# Patient Record
Sex: Male | Born: 1935 | Race: White | Hispanic: No | Marital: Married
Health system: Southern US, Community
[De-identification: ages and names within clinical notes are randomized; demographics above are authoritative.]

---

## 2008-05-03 ENCOUNTER — Ambulatory Visit: Payer: Self-pay | Admitting: Gastroenterology

## 2008-06-22 ENCOUNTER — Ambulatory Visit: Payer: Self-pay | Admitting: Family Medicine

## 2008-07-04 ENCOUNTER — Ambulatory Visit: Payer: Self-pay | Admitting: Family Medicine

## 2008-08-02 ENCOUNTER — Ambulatory Visit: Payer: Self-pay | Admitting: Family Medicine

## 2008-09-01 ENCOUNTER — Ambulatory Visit: Payer: Self-pay | Admitting: Family Medicine

## 2008-10-01 ENCOUNTER — Ambulatory Visit: Payer: Self-pay | Admitting: Family Medicine

## 2009-10-25 ENCOUNTER — Ambulatory Visit: Payer: Self-pay | Admitting: Ophthalmology

## 2010-01-24 ENCOUNTER — Ambulatory Visit: Payer: Self-pay | Admitting: Ophthalmology

## 2012-07-13 ENCOUNTER — Ambulatory Visit: Payer: Self-pay | Admitting: Radiation Oncology

## 2012-08-01 ENCOUNTER — Ambulatory Visit: Payer: Self-pay | Admitting: Radiation Oncology

## 2012-08-13 ENCOUNTER — Inpatient Hospital Stay: Payer: Self-pay | Admitting: Specialist

## 2012-08-13 LAB — COMPREHENSIVE METABOLIC PANEL
Alkaline Phosphatase: 100 U/L (ref 50–136)
BUN: 20 mg/dL — ABNORMAL HIGH (ref 7–18)
Calcium, Total: 8.6 mg/dL (ref 8.5–10.1)
Chloride: 110 mmol/L — ABNORMAL HIGH (ref 98–107)
Co2: 24 mmol/L (ref 21–32)
Creatinine: 1.55 mg/dL — ABNORMAL HIGH (ref 0.60–1.30)
EGFR (Non-African Amer.): 43 — ABNORMAL LOW
Glucose: 206 mg/dL — ABNORMAL HIGH (ref 65–99)
Potassium: 5.3 mmol/L — ABNORMAL HIGH (ref 3.5–5.1)
SGOT(AST): 27 U/L (ref 15–37)
SGPT (ALT): 24 U/L (ref 12–78)
Sodium: 138 mmol/L (ref 136–145)
Total Protein: 6.9 g/dL (ref 6.4–8.2)

## 2012-08-13 LAB — IRON AND TIBC
Iron Bind.Cap.(Total): 436 ug/dL (ref 250–450)
Iron: 24 ug/dL — ABNORMAL LOW (ref 65–175)
Unbound Iron-Bind.Cap.: 412 ug/dL

## 2012-08-13 LAB — FOLATE: Folic Acid: 39.2 ng/mL (ref 3.1–100.0)

## 2012-08-13 LAB — CBC WITH DIFFERENTIAL/PLATELET
Basophil #: 0.1 10*3/uL (ref 0.0–0.1)
Basophil %: 1 %
Eosinophil %: 2.3 %
HCT: 22.3 % — ABNORMAL LOW (ref 40.0–52.0)
HGB: 7 g/dL — ABNORMAL LOW (ref 13.0–18.0)
Lymphocyte #: 0.5 10*3/uL — ABNORMAL LOW (ref 1.0–3.6)
Lymphocyte %: 10.3 %
MCH: 26.9 pg (ref 26.0–34.0)
MCV: 85 fL (ref 80–100)
Monocyte %: 8.3 %
Platelet: 194 10*3/uL (ref 150–440)
RBC: 2.61 10*6/uL — ABNORMAL LOW (ref 4.40–5.90)
RDW: 15.5 % — ABNORMAL HIGH (ref 11.5–14.5)

## 2012-08-13 LAB — PROTIME-INR: Prothrombin Time: 14.1 secs (ref 11.5–14.7)

## 2012-08-13 LAB — HEMOGLOBIN A1C: Hemoglobin A1C: <3.5 % — ABNORMAL LOW (ref 4.2–6.3)

## 2012-08-14 LAB — BASIC METABOLIC PANEL
BUN: 20 mg/dL — ABNORMAL HIGH (ref 7–18)
Chloride: 110 mmol/L — ABNORMAL HIGH (ref 98–107)
Osmolality: 282 (ref 275–301)
Potassium: 4.6 mmol/L (ref 3.5–5.1)

## 2012-08-14 LAB — CBC WITH DIFFERENTIAL/PLATELET
Basophil #: 0.1 10*3/uL (ref 0.0–0.1)
Basophil %: 0.8 %
Eosinophil %: 2.9 %
HCT: 25.8 % — ABNORMAL LOW (ref 40.0–52.0)
Lymphocyte %: 13.4 %
MCH: 28.1 pg (ref 26.0–34.0)
Monocyte %: 10.1 %
Neutrophil #: 5.6 10*3/uL (ref 1.4–6.5)
Platelet: 180 10*3/uL (ref 150–440)
RDW: 15.5 % — ABNORMAL HIGH (ref 11.5–14.5)

## 2012-08-15 LAB — CBC WITH DIFFERENTIAL/PLATELET
Basophil %: 0.9 %
Eosinophil %: 2.3 %
HCT: 25 % — ABNORMAL LOW (ref 40.0–52.0)
HGB: 8.1 g/dL — ABNORMAL LOW (ref 13.0–18.0)
MCH: 27.5 pg (ref 26.0–34.0)
MCHC: 32.4 g/dL (ref 32.0–36.0)
MCV: 85 fL (ref 80–100)
Neutrophil %: 75.7 %
RBC: 2.95 10*6/uL — ABNORMAL LOW (ref 4.40–5.90)
WBC: 7.6 10*3/uL (ref 3.8–10.6)

## 2012-08-20 ENCOUNTER — Ambulatory Visit: Payer: Self-pay | Admitting: Urology

## 2012-08-20 LAB — HEMOGLOBIN: HGB: 8.6 g/dL — ABNORMAL LOW (ref 13.0–18.0)

## 2012-09-01 ENCOUNTER — Ambulatory Visit: Payer: Self-pay | Admitting: Urology

## 2012-09-01 ENCOUNTER — Ambulatory Visit: Payer: Self-pay | Admitting: Radiation Oncology

## 2012-10-01 ENCOUNTER — Ambulatory Visit: Payer: Self-pay | Admitting: Radiation Oncology

## 2012-11-10 ENCOUNTER — Ambulatory Visit: Payer: Self-pay | Admitting: Family Medicine

## 2012-11-18 ENCOUNTER — Emergency Department: Payer: Self-pay

## 2012-11-18 LAB — COMPREHENSIVE METABOLIC PANEL
Alkaline Phosphatase: 148 U/L — ABNORMAL HIGH (ref 50–136)
Anion Gap: 6 — ABNORMAL LOW (ref 7–16)
BUN: 19 mg/dL — ABNORMAL HIGH (ref 7–18)
Calcium, Total: 9.3 mg/dL (ref 8.5–10.1)
Chloride: 98 mmol/L (ref 98–107)
Co2: 27 mmol/L (ref 21–32)
Creatinine: 1.35 mg/dL — ABNORMAL HIGH (ref 0.60–1.30)
EGFR (African American): 60 — ABNORMAL LOW
Osmolality: 272 (ref 275–301)
SGOT(AST): 39 U/L — ABNORMAL HIGH (ref 15–37)
Sodium: 131 mmol/L — ABNORMAL LOW (ref 136–145)
Total Protein: 8 g/dL (ref 6.4–8.2)

## 2012-11-18 LAB — CBC
HCT: 37.3 % — ABNORMAL LOW (ref 40.0–52.0)
HGB: 12.2 g/dL — ABNORMAL LOW (ref 13.0–18.0)
MCH: 24.8 pg — ABNORMAL LOW (ref 26.0–34.0)
MCHC: 32.6 g/dL (ref 32.0–36.0)
Platelet: 235 10*3/uL (ref 150–440)
RBC: 4.9 10*6/uL (ref 4.40–5.90)
RDW: 17.2 % — ABNORMAL HIGH (ref 11.5–14.5)
WBC: 10.7 10*3/uL — ABNORMAL HIGH (ref 3.8–10.6)

## 2012-11-18 LAB — URINALYSIS, COMPLETE
Bilirubin,UR: NEGATIVE
Blood: NEGATIVE
Ketone: NEGATIVE
Leukocyte Esterase: NEGATIVE
Nitrite: NEGATIVE
Ph: 6 (ref 4.5–8.0)
Specific Gravity: 1.024 (ref 1.003–1.030)
Squamous Epithelial: NONE SEEN

## 2012-11-19 ENCOUNTER — Ambulatory Visit: Payer: Self-pay | Admitting: Internal Medicine

## 2012-11-19 LAB — PROTIME-INR
INR: 1.1
Prothrombin Time: 13.9 secs (ref 11.5–14.7)

## 2012-11-20 LAB — CANCER ANTIGEN 19-9: CA 19-9: 189700 U/mL — ABNORMAL HIGH (ref 0–35)

## 2012-11-23 ENCOUNTER — Ambulatory Visit: Payer: Self-pay | Admitting: Internal Medicine

## 2012-11-23 LAB — PSA: PSA: 2.1 ng/mL (ref 0.0–4.0)

## 2012-11-25 LAB — HEPATIC FUNCTION PANEL A (ARMC)
Albumin: 3.5 g/dL (ref 3.4–5.0)
Alkaline Phosphatase: 137 U/L — ABNORMAL HIGH (ref 50–136)
Bilirubin,Total: 0.4 mg/dL (ref 0.2–1.0)
Total Protein: 7.3 g/dL (ref 6.4–8.2)

## 2012-11-30 ENCOUNTER — Ambulatory Visit: Payer: Self-pay | Admitting: Cardiothoracic Surgery

## 2012-12-01 ENCOUNTER — Ambulatory Visit: Payer: Self-pay | Admitting: Internal Medicine

## 2012-12-08 LAB — COMPREHENSIVE METABOLIC PANEL
Albumin: 3.3 g/dL — ABNORMAL LOW (ref 3.4–5.0)
Anion Gap: 9 (ref 7–16)
BUN: 17 mg/dL (ref 7–18)
Bilirubin,Total: 0.3 mg/dL (ref 0.2–1.0)
Co2: 28 mmol/L (ref 21–32)
Creatinine: 1.28 mg/dL (ref 0.60–1.30)
EGFR (African American): >60
EGFR (Non-African Amer.): 55 — ABNORMAL LOW
Glucose: 187 mg/dL — ABNORMAL HIGH (ref 65–99)
Potassium: 4.6 mmol/L (ref 3.5–5.1)
SGOT(AST): 27 U/L (ref 15–37)
SGPT (ALT): 24 U/L (ref 12–78)
Sodium: 134 mmol/L — ABNORMAL LOW (ref 136–145)
Total Protein: 6.7 g/dL (ref 6.4–8.2)

## 2012-12-08 LAB — CBC CANCER CENTER
Basophil %: 1.1 %
HCT: 30.9 % — ABNORMAL LOW (ref 40.0–52.0)
Lymphocyte #: 0.6 x10 3/mm — ABNORMAL LOW (ref 1.0–3.6)
Lymphocyte %: 7.6 %
MCH: 24.7 pg — ABNORMAL LOW (ref 26.0–34.0)
Monocyte #: 0.9 x10 3/mm (ref 0.2–1.0)
Neutrophil #: 6.5 x10 3/mm (ref 1.4–6.5)
WBC: 8.5 x10 3/mm (ref 3.8–10.6)

## 2012-12-09 ENCOUNTER — Inpatient Hospital Stay: Payer: Self-pay | Admitting: Internal Medicine

## 2012-12-09 LAB — BASIC METABOLIC PANEL
Anion Gap: 7 (ref 7–16)
BUN: 22 mg/dL — ABNORMAL HIGH (ref 7–18)
Calcium, Total: 9.1 mg/dL (ref 8.5–10.1)
Chloride: 97 mmol/L — ABNORMAL LOW (ref 98–107)
Creatinine: 1.17 mg/dL (ref 0.60–1.30)
EGFR (African American): >60
Glucose: 202 mg/dL — ABNORMAL HIGH (ref 65–99)
Potassium: 4.5 mmol/L (ref 3.5–5.1)
Sodium: 129 mmol/L — ABNORMAL LOW (ref 136–145)

## 2012-12-10 LAB — BASIC METABOLIC PANEL
Anion Gap: 9 (ref 7–16)
Calcium, Total: 8.6 mg/dL (ref 8.5–10.1)
Chloride: 100 mmol/L (ref 98–107)
Creatinine: 1.38 mg/dL — ABNORMAL HIGH (ref 0.60–1.30)
EGFR (African American): 58 — ABNORMAL LOW
EGFR (Non-African Amer.): 50 — ABNORMAL LOW
Glucose: 177 mg/dL — ABNORMAL HIGH (ref 65–99)

## 2012-12-10 LAB — CBC WITH DIFFERENTIAL/PLATELET
Eosinophil #: 0 10*3/uL (ref 0.0–0.7)
Eosinophil %: 0 %
HCT: 31.8 % — ABNORMAL LOW (ref 40.0–52.0)
HGB: 10.6 g/dL — ABNORMAL LOW (ref 13.0–18.0)
Lymphocyte %: 3.3 %
MCH: 25.1 pg — ABNORMAL LOW (ref 26.0–34.0)
MCHC: 33.3 g/dL (ref 32.0–36.0)
MCV: 75 fL — ABNORMAL LOW (ref 80–100)
Monocyte #: 0.8 x10 3/mm (ref 0.2–1.0)
Monocyte %: 6.8 %
Neutrophil #: 10.8 10*3/uL — ABNORMAL HIGH (ref 1.4–6.5)
Neutrophil %: 89.7 %
Platelet: 185 10*3/uL (ref 150–440)
RBC: 4.22 10*6/uL — ABNORMAL LOW (ref 4.40–5.90)
RDW: 16.4 % — ABNORMAL HIGH (ref 11.5–14.5)
WBC: 12.1 10*3/uL — ABNORMAL HIGH (ref 3.8–10.6)

## 2012-12-11 LAB — BASIC METABOLIC PANEL
Anion Gap: 8 (ref 7–16)
BUN: 20 mg/dL — ABNORMAL HIGH (ref 7–18)
Co2: 24 mmol/L (ref 21–32)
EGFR (African American): >60
Glucose: 138 mg/dL — ABNORMAL HIGH (ref 65–99)
Potassium: 4 mmol/L (ref 3.5–5.1)
Sodium: 133 mmol/L — ABNORMAL LOW (ref 136–145)

## 2012-12-12 LAB — COMPREHENSIVE METABOLIC PANEL
Albumin: 2.8 g/dL — ABNORMAL LOW (ref 3.4–5.0)
Bilirubin,Total: 0.4 mg/dL (ref 0.2–1.0)
Chloride: 102 mmol/L (ref 98–107)
EGFR (African American): >60
Glucose: 133 mg/dL — ABNORMAL HIGH (ref 65–99)
Potassium: 3.9 mmol/L (ref 3.5–5.1)

## 2012-12-12 LAB — MAGNESIUM: Magnesium: 1.5 mg/dL — ABNORMAL LOW

## 2012-12-15 ENCOUNTER — Ambulatory Visit: Payer: Self-pay | Admitting: Internal Medicine

## 2012-12-15 LAB — CBC CANCER CENTER
Basophil #: 0.1 x10 3/mm (ref 0.0–0.1)
Eosinophil %: 2.7 %
HCT: 32.7 % — ABNORMAL LOW (ref 40.0–52.0)
Lymphocyte #: 0.8 x10 3/mm — ABNORMAL LOW (ref 1.0–3.6)
MCH: 24.6 pg — ABNORMAL LOW (ref 26.0–34.0)
MCHC: 32.1 g/dL (ref 32.0–36.0)
MCV: 77 fL — ABNORMAL LOW (ref 80–100)
Monocyte #: 0.6 x10 3/mm (ref 0.2–1.0)
Neutrophil #: 15.6 x10 3/mm — ABNORMAL HIGH (ref 1.4–6.5)
Platelet: 163 x10 3/mm (ref 150–440)
RDW: 16.3 % — ABNORMAL HIGH (ref 11.5–14.5)
WBC: 17.6 x10 3/mm — ABNORMAL HIGH (ref 3.8–10.6)

## 2012-12-15 LAB — CREATININE, SERUM: EGFR (Non-African Amer.): 54 — ABNORMAL LOW

## 2012-12-15 LAB — MAGNESIUM: Magnesium: 2 mg/dL

## 2012-12-18 LAB — CBC CANCER CENTER
Eosinophil %: 2.7 %
HCT: 31.8 % — ABNORMAL LOW (ref 40.0–52.0)
Lymphocyte #: 0.7 x10 3/mm — ABNORMAL LOW (ref 1.0–3.6)
Lymphocyte %: 9.5 %
MCH: 25 pg — ABNORMAL LOW (ref 26.0–34.0)
MCHC: 32.8 g/dL (ref 32.0–36.0)
MCV: 76 fL — ABNORMAL LOW (ref 80–100)
Neutrophil #: 6.2 x10 3/mm (ref 1.4–6.5)
Neutrophil %: 80.1 %
Platelet: 159 x10 3/mm (ref 150–440)
RDW: 16.7 % — ABNORMAL HIGH (ref 11.5–14.5)
WBC: 7.8 x10 3/mm (ref 3.8–10.6)

## 2012-12-18 LAB — CREATININE, SERUM: Creatinine: 1.17 mg/dL (ref 0.60–1.30)

## 2012-12-18 LAB — MAGNESIUM: Magnesium: 1.6 mg/dL — ABNORMAL LOW

## 2012-12-18 LAB — POTASSIUM: Potassium: 4.2 mmol/L (ref 3.5–5.1)

## 2012-12-22 LAB — COMPREHENSIVE METABOLIC PANEL
Albumin: 3 g/dL — ABNORMAL LOW (ref 3.4–5.0)
Alkaline Phosphatase: 171 U/L — ABNORMAL HIGH (ref 50–136)
Anion Gap: 12 (ref 7–16)
BUN: 13 mg/dL (ref 7–18)
Bilirubin,Total: 0.4 mg/dL (ref 0.2–1.0)
Chloride: 94 mmol/L — ABNORMAL LOW (ref 98–107)
Co2: 27 mmol/L (ref 21–32)
Creatinine: 1.36 mg/dL — ABNORMAL HIGH (ref 0.60–1.30)
EGFR (Non-African Amer.): 51 — ABNORMAL LOW
Glucose: 162 mg/dL — ABNORMAL HIGH (ref 65–99)
Potassium: 4 mmol/L (ref 3.5–5.1)
SGPT (ALT): 24 U/L (ref 12–78)

## 2012-12-22 LAB — CBC CANCER CENTER
Basophil %: 0.6 %
Eosinophil #: 0.4 x10 3/mm (ref 0.0–0.7)
Eosinophil %: 2.9 %
HGB: 10.4 g/dL — ABNORMAL LOW (ref 13.0–18.0)
Lymphocyte %: 5.4 %
MCHC: 32.1 g/dL (ref 32.0–36.0)
MCV: 76 fL — ABNORMAL LOW (ref 80–100)
Monocyte #: 1.2 x10 3/mm — ABNORMAL HIGH (ref 0.2–1.0)
Neutrophil #: 11.9 x10 3/mm — ABNORMAL HIGH (ref 1.4–6.5)
Neutrophil %: 82.9 %
RBC: 4.25 10*6/uL — ABNORMAL LOW (ref 4.40–5.90)
RDW: 17.1 % — ABNORMAL HIGH (ref 11.5–14.5)

## 2012-12-23 ENCOUNTER — Ambulatory Visit: Payer: Self-pay | Admitting: Internal Medicine

## 2012-12-24 LAB — CREATININE, SERUM
Creatinine: 1.4 mg/dL — ABNORMAL HIGH (ref 0.60–1.30)
EGFR (African American): 57 — ABNORMAL LOW
EGFR (Non-African Amer.): 49 — ABNORMAL LOW

## 2012-12-24 LAB — CBC CANCER CENTER
Basophil #: 0.1 x10 3/mm (ref 0.0–0.1)
Basophil %: 0.6 %
Eosinophil #: 0.7 x10 3/mm (ref 0.0–0.7)
Eosinophil %: 4.1 %
HCT: 32.9 % — ABNORMAL LOW (ref 40.0–52.0)
HGB: 10.4 g/dL — ABNORMAL LOW (ref 13.0–18.0)
Lymphocyte #: 0.8 x10 3/mm — ABNORMAL LOW (ref 1.0–3.6)
Lymphocyte %: 4.6 %
MCH: 24.4 pg — ABNORMAL LOW (ref 26.0–34.0)
Monocyte %: 6.4 %
Neutrophil #: 14.7 x10 3/mm — ABNORMAL HIGH (ref 1.4–6.5)
Neutrophil %: 84.3 %
Platelet: 187 x10 3/mm (ref 150–440)

## 2012-12-29 LAB — CBC CANCER CENTER
Eosinophil #: 0.3 x10 3/mm (ref 0.0–0.7)
HCT: 34.1 % — ABNORMAL LOW (ref 40.0–52.0)
HGB: 10.9 g/dL — ABNORMAL LOW (ref 13.0–18.0)
MCV: 78 fL — ABNORMAL LOW (ref 80–100)
Monocyte %: 7.6 %
Neutrophil #: 11.1 x10 3/mm — ABNORMAL HIGH (ref 1.4–6.5)
Neutrophil %: 81.6 %
RBC: 4.38 10*6/uL — ABNORMAL LOW (ref 4.40–5.90)
WBC: 13.6 x10 3/mm — ABNORMAL HIGH (ref 3.8–10.6)

## 2012-12-29 LAB — COMPREHENSIVE METABOLIC PANEL
Albumin: 3.2 g/dL — ABNORMAL LOW (ref 3.4–5.0)
Anion Gap: 11 (ref 7–16)
Bilirubin,Total: 0.4 mg/dL (ref 0.2–1.0)
Calcium, Total: 9.2 mg/dL (ref 8.5–10.1)
Chloride: 98 mmol/L (ref 98–107)
Co2: 28 mmol/L (ref 21–32)
EGFR (African American): >60
Osmolality: 276 (ref 275–301)
Potassium: 5.1 mmol/L (ref 3.5–5.1)
SGOT(AST): 22 U/L (ref 15–37)
SGPT (ALT): 20 U/L (ref 12–78)

## 2013-01-01 ENCOUNTER — Ambulatory Visit: Payer: Self-pay | Admitting: Internal Medicine

## 2013-01-05 LAB — MAGNESIUM: Magnesium: 2.2 mg/dL

## 2013-01-05 LAB — COMPREHENSIVE METABOLIC PANEL
Anion Gap: 8 (ref 7–16)
BUN: 12 mg/dL (ref 7–18)
Chloride: 97 mmol/L — ABNORMAL LOW (ref 98–107)
Co2: 30 mmol/L (ref 21–32)
Creatinine: 1.39 mg/dL — ABNORMAL HIGH (ref 0.60–1.30)
EGFR (Non-African Amer.): 50 — ABNORMAL LOW
Osmolality: 275 (ref 275–301)
SGOT(AST): 24 U/L (ref 15–37)
SGPT (ALT): 19 U/L (ref 12–78)
Sodium: 135 mmol/L — ABNORMAL LOW (ref 136–145)
Total Protein: 6.7 g/dL (ref 6.4–8.2)

## 2013-01-05 LAB — CBC CANCER CENTER
Basophil #: 0.1 x10 3/mm (ref 0.0–0.1)
Basophil %: 1.5 %
Eosinophil %: 4.1 %
MCH: 25.4 pg — ABNORMAL LOW (ref 26.0–34.0)
MCHC: 32.3 g/dL (ref 32.0–36.0)
Monocyte #: 1.2 x10 3/mm — ABNORMAL HIGH (ref 0.2–1.0)
Neutrophil %: 72.5 %
Platelet: 233 x10 3/mm (ref 150–440)
WBC: 9.6 x10 3/mm (ref 3.8–10.6)

## 2013-01-12 LAB — CBC CANCER CENTER
Basophil #: 0.2 x10 3/mm — ABNORMAL HIGH (ref 0.0–0.1)
Basophil %: 0.9 %
Eosinophil %: 3.7 %
HCT: 32.8 % — ABNORMAL LOW (ref 40.0–52.0)
Lymphocyte #: 0.9 x10 3/mm — ABNORMAL LOW (ref 1.0–3.6)
MCH: 25.3 pg — ABNORMAL LOW (ref 26.0–34.0)
MCV: 78 fL — ABNORMAL LOW (ref 80–100)
Monocyte %: 4.1 %
Neutrophil #: 17.5 x10 3/mm — ABNORMAL HIGH (ref 1.4–6.5)
Neutrophil %: 86.9 %
Platelet: 199 x10 3/mm (ref 150–440)
RBC: 4.21 10*6/uL — ABNORMAL LOW (ref 4.40–5.90)
RDW: 17.5 % — ABNORMAL HIGH (ref 11.5–14.5)

## 2013-01-12 LAB — MAGNESIUM: Magnesium: 2.2 mg/dL

## 2013-01-13 LAB — CANCER ANTIGEN 19-9: CA 19-9: 114900 U/mL — ABNORMAL HIGH (ref 0–35)

## 2013-01-19 LAB — CBC CANCER CENTER
Basophil #: 0.1 x10 3/mm (ref 0.0–0.1)
Basophil %: 0.6 %
Eosinophil #: 0.4 x10 3/mm (ref 0.0–0.7)
HCT: 34.3 % — ABNORMAL LOW (ref 40.0–52.0)
HGB: 10.9 g/dL — ABNORMAL LOW (ref 13.0–18.0)
Lymphocyte %: 7.1 %
MCH: 25.3 pg — ABNORMAL LOW (ref 26.0–34.0)
MCV: 79 fL — ABNORMAL LOW (ref 80–100)
Monocyte #: 1.3 x10 3/mm — ABNORMAL HIGH (ref 0.2–1.0)
Neutrophil #: 8.3 x10 3/mm — ABNORMAL HIGH (ref 1.4–6.5)
Neutrophil %: 76.2 %
RBC: 4.31 10*6/uL — ABNORMAL LOW (ref 4.40–5.90)
RDW: 19.4 % — ABNORMAL HIGH (ref 11.5–14.5)
WBC: 10.9 x10 3/mm — ABNORMAL HIGH (ref 3.8–10.6)

## 2013-01-19 LAB — COMPREHENSIVE METABOLIC PANEL WITH GFR
Albumin: 3.3 g/dL — ABNORMAL LOW
Alkaline Phosphatase: 167 U/L — ABNORMAL HIGH
Anion Gap: 12
BUN: 12 mg/dL
Bilirubin,Total: 0.4 mg/dL
Calcium, Total: 9.1 mg/dL
Chloride: 100 mmol/L
Co2: 24 mmol/L
Creatinine: 1.21 mg/dL
EGFR (African American): >60
EGFR (Non-African Amer.): 59 — ABNORMAL LOW
Glucose: 175 mg/dL — ABNORMAL HIGH
Osmolality: 276
Potassium: 3.3 mmol/L — ABNORMAL LOW
SGOT(AST): 24 U/L
SGPT (ALT): 20 U/L
Sodium: 136 mmol/L
Total Protein: 6.9 g/dL

## 2013-01-19 LAB — MAGNESIUM: Magnesium: 1.7 mg/dL — ABNORMAL LOW

## 2013-01-21 LAB — CBC CANCER CENTER
Basophil #: 0.1 x10 3/mm (ref 0.0–0.1)
Basophil %: 0.6 %
Eosinophil #: 0.3 x10 3/mm (ref 0.0–0.7)
Eosinophil %: 2.3 %
HCT: 33.8 % — ABNORMAL LOW (ref 40.0–52.0)
HGB: 10.7 g/dL — ABNORMAL LOW (ref 13.0–18.0)
MCH: 25.2 pg — ABNORMAL LOW (ref 26.0–34.0)
Monocyte #: 1.3 x10 3/mm — ABNORMAL HIGH (ref 0.2–1.0)
Neutrophil %: 81.8 %
RBC: 4.26 10*6/uL — ABNORMAL LOW (ref 4.40–5.90)

## 2013-01-21 LAB — CREATININE, SERUM
Creatinine: 1.33 mg/dL — ABNORMAL HIGH (ref 0.60–1.30)
EGFR (African American): >60
EGFR (Non-African Amer.): 52 — ABNORMAL LOW

## 2013-01-21 LAB — POTASSIUM: Potassium: 3.6 mmol/L (ref 3.5–5.1)

## 2013-01-25 LAB — BASIC METABOLIC PANEL
Anion Gap: 11 (ref 7–16)
Chloride: 99 mmol/L (ref 98–107)
EGFR (Non-African Amer.): 52 — ABNORMAL LOW
Glucose: 170 mg/dL — ABNORMAL HIGH (ref 65–99)
Osmolality: 280 (ref 275–301)

## 2013-01-27 LAB — COMPREHENSIVE METABOLIC PANEL
Albumin: 3.3 g/dL — ABNORMAL LOW (ref 3.4–5.0)
Alkaline Phosphatase: 182 U/L — ABNORMAL HIGH (ref 50–136)
Anion Gap: 11 (ref 7–16)
BUN: 18 mg/dL (ref 7–18)
Bilirubin,Total: 0.5 mg/dL (ref 0.2–1.0)
Co2: 25 mmol/L (ref 21–32)
Creatinine: 1.17 mg/dL (ref 0.60–1.30)
EGFR (African American): >60
EGFR (Non-African Amer.): >60
Glucose: 190 mg/dL — ABNORMAL HIGH (ref 65–99)
Osmolality: 277 (ref 275–301)
SGOT(AST): 22 U/L (ref 15–37)
SGPT (ALT): 21 U/L (ref 12–78)
Total Protein: 6.5 g/dL (ref 6.4–8.2)

## 2013-01-27 LAB — CBC CANCER CENTER
Basophil #: 0.1 x10 3/mm (ref 0.0–0.1)
Basophil %: 0.4 %
Eosinophil %: 0.7 %
HGB: 10.2 g/dL — ABNORMAL LOW (ref 13.0–18.0)
Lymphocyte #: 0.9 x10 3/mm — ABNORMAL LOW (ref 1.0–3.6)
MCH: 25.4 pg — ABNORMAL LOW (ref 26.0–34.0)
MCHC: 31.8 g/dL — ABNORMAL LOW (ref 32.0–36.0)
Neutrophil #: 27.1 x10 3/mm — ABNORMAL HIGH (ref 1.4–6.5)
Neutrophil %: 93.9 %
Platelet: 75 x10 3/mm — ABNORMAL LOW (ref 150–440)
RDW: 19.4 % — ABNORMAL HIGH (ref 11.5–14.5)
WBC: 28.8 x10 3/mm — ABNORMAL HIGH (ref 3.8–10.6)

## 2013-01-27 LAB — CANCER ANTIGEN 19-9

## 2013-01-27 LAB — MAGNESIUM: Magnesium: 1.9 mg/dL

## 2013-01-28 LAB — PSA: PSA: 0.3 ng/mL (ref 0.0–4.0)

## 2013-01-29 LAB — BASIC METABOLIC PANEL
Creatinine: 1.38 mg/dL — ABNORMAL HIGH (ref 0.60–1.30)
EGFR (Non-African Amer.): 50 — ABNORMAL LOW
Potassium: 3.8 mmol/L (ref 3.5–5.1)
Sodium: 136 mmol/L (ref 136–145)

## 2013-02-01 ENCOUNTER — Ambulatory Visit: Payer: Self-pay | Admitting: Internal Medicine

## 2013-02-02 LAB — CBC CANCER CENTER
Basophil %: 0.5 %
HGB: 10.4 g/dL — ABNORMAL LOW (ref 13.0–18.0)
MCH: 25.7 pg — ABNORMAL LOW (ref 26.0–34.0)
Monocyte #: 0.6 x10 3/mm (ref 0.2–1.0)
Monocyte %: 8.1 %
Platelet: 101 x10 3/mm — ABNORMAL LOW (ref 150–440)
RBC: 4.03 10*6/uL — ABNORMAL LOW (ref 4.40–5.90)
WBC: 7.7 x10 3/mm (ref 3.8–10.6)

## 2013-02-02 LAB — BASIC METABOLIC PANEL
Anion Gap: 8 (ref 7–16)
Calcium, Total: 9.2 mg/dL (ref 8.5–10.1)
Chloride: 99 mmol/L (ref 98–107)
Co2: 26 mmol/L (ref 21–32)
EGFR (African American): 58 — ABNORMAL LOW
Glucose: 160 mg/dL — ABNORMAL HIGH (ref 65–99)
Osmolality: 270 (ref 275–301)
Potassium: 3.3 mmol/L — ABNORMAL LOW (ref 3.5–5.1)
Sodium: 133 mmol/L — ABNORMAL LOW (ref 136–145)

## 2013-02-02 LAB — MAGNESIUM: Magnesium: 2 mg/dL

## 2013-02-03 LAB — CANCER ANTIGEN 19-9

## 2013-02-09 LAB — CBC CANCER CENTER
Basophil #: 0.2 x10 3/mm — ABNORMAL HIGH (ref 0.0–0.1)
HGB: 10.8 g/dL — ABNORMAL LOW (ref 13.0–18.0)
Lymphocyte %: 6 %
MCH: 26.3 pg (ref 26.0–34.0)
MCV: 83 fL (ref 80–100)
Monocyte #: 1.2 x10 3/mm — ABNORMAL HIGH (ref 0.2–1.0)
Neutrophil #: 10.2 x10 3/mm — ABNORMAL HIGH (ref 1.4–6.5)
RBC: 4.12 10*6/uL — ABNORMAL LOW (ref 4.40–5.90)
RDW: 22.8 % — ABNORMAL HIGH (ref 11.5–14.5)

## 2013-02-09 LAB — MAGNESIUM: Magnesium: 1.8 mg/dL

## 2013-02-09 LAB — COMPREHENSIVE METABOLIC PANEL
Albumin: 3 g/dL — ABNORMAL LOW (ref 3.4–5.0)
Alkaline Phosphatase: 164 U/L — ABNORMAL HIGH (ref 50–136)
BUN: 8 mg/dL (ref 7–18)
Calcium, Total: 9 mg/dL (ref 8.5–10.1)
Chloride: 102 mmol/L (ref 98–107)
Creatinine: 1.12 mg/dL (ref 0.60–1.30)
EGFR (African American): >60
Osmolality: 275 (ref 275–301)
Potassium: 3.7 mmol/L (ref 3.5–5.1)
SGOT(AST): 23 U/L (ref 15–37)
Sodium: 137 mmol/L (ref 136–145)

## 2013-02-09 LAB — URIC ACID: Uric Acid: 5 mg/dL (ref 3.5–7.2)

## 2013-02-11 LAB — MAGNESIUM: Magnesium: 1.8 mg/dL

## 2013-02-12 ENCOUNTER — Observation Stay: Payer: Self-pay | Admitting: Internal Medicine

## 2013-02-12 LAB — CBC WITH DIFFERENTIAL/PLATELET
Basophil #: 0.1 10*3/uL (ref 0.0–0.1)
Basophil %: 0.7 %
Eosinophil #: 0.2 10*3/uL (ref 0.0–0.7)
Eosinophil %: 1.3 %
HCT: 34.4 % — ABNORMAL LOW (ref 40.0–52.0)
Lymphocyte #: 0.6 10*3/uL — ABNORMAL LOW (ref 1.0–3.6)
MCHC: 31.7 g/dL — ABNORMAL LOW (ref 32.0–36.0)
Monocyte %: 11.6 %
Neutrophil #: 10.3 10*3/uL — ABNORMAL HIGH (ref 1.4–6.5)
Neutrophil %: 81.3 %
RDW: 24.2 % — ABNORMAL HIGH (ref 11.5–14.5)

## 2013-02-12 LAB — COMPREHENSIVE METABOLIC PANEL
Anion Gap: 11 (ref 7–16)
Osmolality: 272 (ref 275–301)
Potassium: 3.9 mmol/L (ref 3.5–5.1)
Sodium: 136 mmol/L (ref 136–145)
Total Protein: 5.8 g/dL — ABNORMAL LOW (ref 6.4–8.2)

## 2013-02-14 LAB — BASIC METABOLIC PANEL
BUN: 7 mg/dL (ref 7–18)
Creatinine: 1 mg/dL (ref 0.60–1.30)
Osmolality: 271 (ref 275–301)
Potassium: 3.8 mmol/L (ref 3.5–5.1)
Sodium: 136 mmol/L (ref 136–145)

## 2013-02-14 LAB — CBC WITH DIFFERENTIAL/PLATELET
Basophil #: 0.1 10*3/uL (ref 0.0–0.1)
Basophil %: 0.8 %
Eosinophil #: 0.2 10*3/uL (ref 0.0–0.7)
Eosinophil %: 1.6 %
HCT: 28.5 % — ABNORMAL LOW (ref 40.0–52.0)
HGB: 9.2 g/dL — ABNORMAL LOW (ref 13.0–18.0)
Lymphocyte #: 0.7 10*3/uL — ABNORMAL LOW (ref 1.0–3.6)
MCHC: 32.3 g/dL (ref 32.0–36.0)
Monocyte #: 1.2 x10 3/mm — ABNORMAL HIGH (ref 0.2–1.0)
Monocyte %: 10.5 %
Neutrophil #: 9.3 10*3/uL — ABNORMAL HIGH (ref 1.4–6.5)
RBC: 3.41 10*6/uL — ABNORMAL LOW (ref 4.40–5.90)

## 2013-02-16 LAB — CBC CANCER CENTER
Basophil #: 0.1 "x10 3/mm "
Basophil %: 0.7 %
Eosinophil #: 0.2 "x10 3/mm "
Eosinophil %: 1.8 %
HCT: 33.5 % — ABNORMAL LOW
HGB: 10.5 g/dL — ABNORMAL LOW
Lymphocyte %: 5.7 %
Lymphs Abs: 0.5 "x10 3/mm " — ABNORMAL LOW
MCH: 26.5 pg
MCHC: 31.5 g/dL — ABNORMAL LOW
MCV: 84 fL
Monocyte #: 1.1 "x10 3/mm " — ABNORMAL HIGH
Monocyte %: 12.6 %
Neutrophil #: 7.1 "x10 3/mm " — ABNORMAL HIGH
Neutrophil %: 79.2 %
Platelet: 209 "x10 3/mm "
RBC: 3.97 "x10 6/mm " — ABNORMAL LOW
RDW: 23.9 % — ABNORMAL HIGH
WBC: 9 "x10 3/mm "

## 2013-02-16 LAB — CREATININE, SERUM
Creatinine: 1.08 mg/dL
EGFR (African American): >60
EGFR (Non-African Amer.): >60

## 2013-02-16 LAB — MAGNESIUM: Magnesium: 2 mg/dL

## 2013-02-20 ENCOUNTER — Observation Stay: Payer: Self-pay | Admitting: Internal Medicine

## 2013-02-20 LAB — BASIC METABOLIC PANEL
Anion Gap: 7 (ref 7–16)
BUN: 15 mg/dL (ref 7–18)
Co2: 26 mmol/L (ref 21–32)
EGFR (African American): >60
EGFR (Non-African Amer.): >60
Osmolality: 275 (ref 275–301)
Potassium: 3.9 mmol/L (ref 3.5–5.1)

## 2013-02-20 LAB — PROTIME-INR
INR: 1.2
Prothrombin Time: 15 secs — ABNORMAL HIGH (ref 11.5–14.7)

## 2013-02-20 LAB — URINALYSIS, COMPLETE
Leukocyte Esterase: NEGATIVE
Protein: 100
Specific Gravity: 1.023 (ref 1.003–1.030)
Squamous Epithelial: 1
WBC UR: 2 /HPF (ref 0–5)

## 2013-02-20 LAB — CBC
HCT: 29.3 % — ABNORMAL LOW (ref 40.0–52.0)
HGB: 9.4 g/dL — ABNORMAL LOW (ref 13.0–18.0)
Platelet: 91 10*3/uL — ABNORMAL LOW (ref 150–440)
RBC: 3.43 10*6/uL — ABNORMAL LOW (ref 4.40–5.90)
WBC: 6.6 10*3/uL (ref 3.8–10.6)

## 2013-02-20 LAB — TROPONIN I: Troponin-I: <0.02 ng/mL

## 2013-03-03 ENCOUNTER — Ambulatory Visit: Payer: Self-pay | Admitting: Internal Medicine

## 2013-04-03 ENCOUNTER — Ambulatory Visit: Payer: Self-pay | Admitting: Internal Medicine

## 2013-05-03 DEATH — deceased

## 2014-06-13 IMAGING — CT CT ABD-PELV W/ CM
1 of 3 series · 12 of 32 positions shown, 18 images · IV contrast (isovue)
Comparison: none

REASON FOR EXAM: (1) severe epigastric and LUQ pain x 4 months,
progressively worse; (2) see abov
COMMENTS:

PROCEDURE:     CT  - CT ABDOMEN / PELVIS  W  - November 18, 2012 [DATE]
RESULT:     Comparison:  None
TECHNIQUE: Multiple axial images of the abdomen and pelvis were performed
from the lung bases to the pubic symphysis, with p.o. contrast and with 100
mL of Isovue 300 intravenous contrast.

[Series 2: 3mm soft tissue · axial · 0.81mm/px · z∈[-448,-22]mm · 12 of 168 slices shown, 18 images]
[im 13/168  soft-tissue]
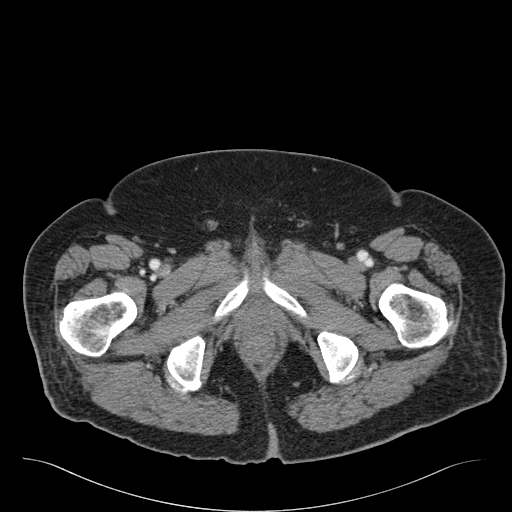
[im 13/168  bone]
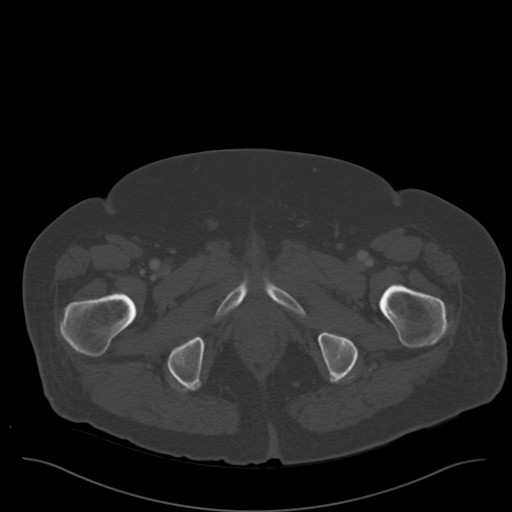
[im 26/168  soft-tissue]
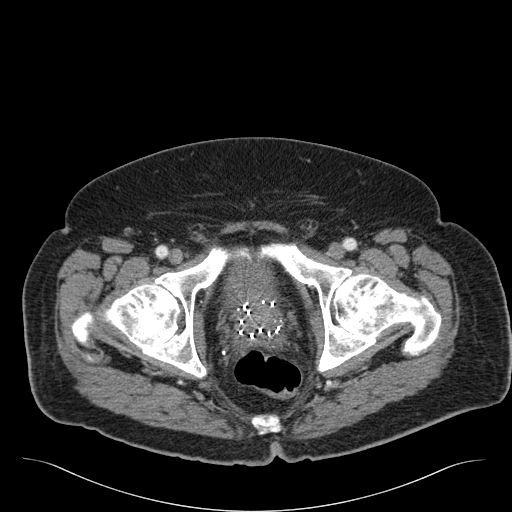
[im 39/168  soft-tissue]
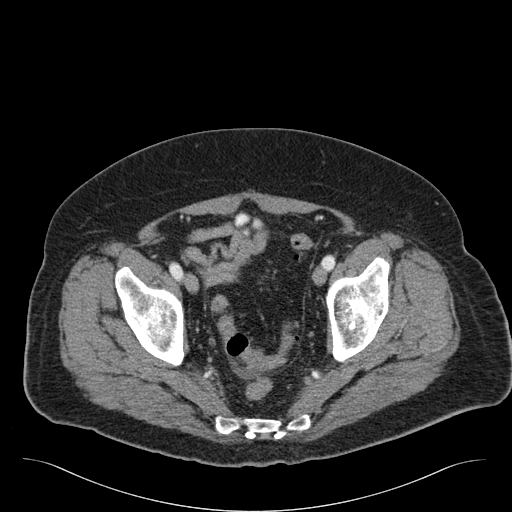
[im 52/168  soft-tissue]
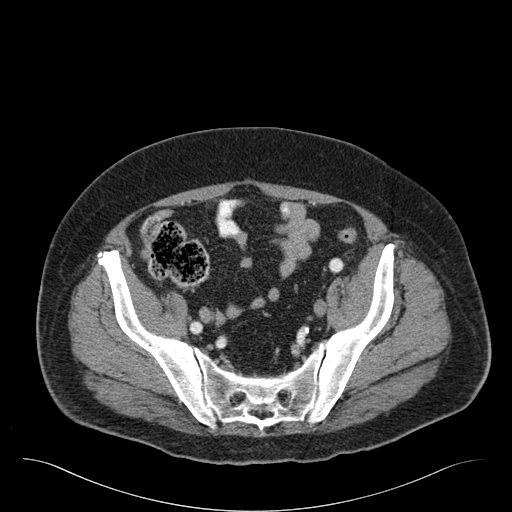
[im 65/168  soft-tissue]
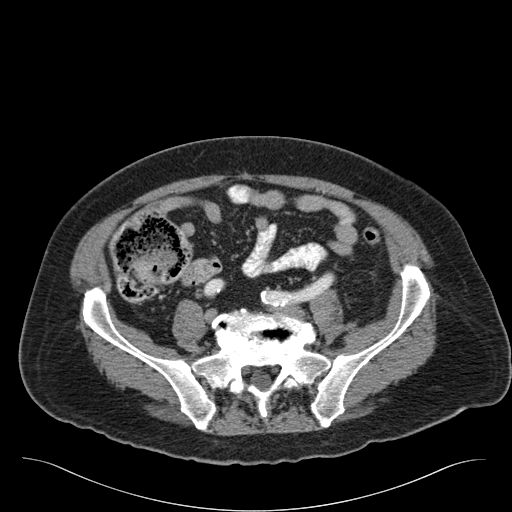
[im 78/168  soft-tissue]
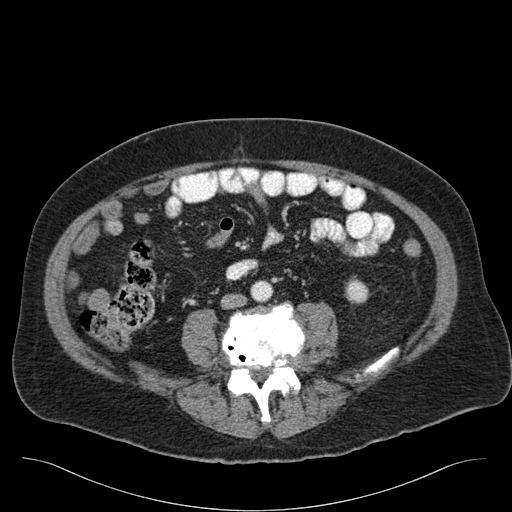
[im 90/168  soft-tissue]
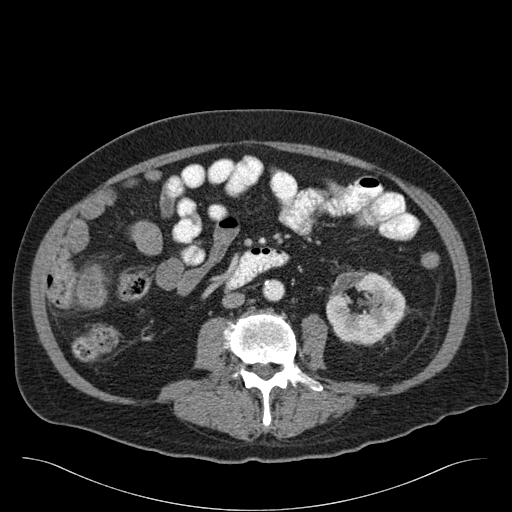
[im 103/168  soft-tissue]
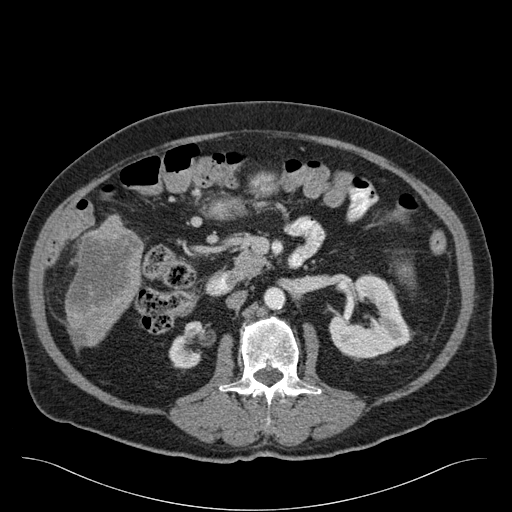
[im 116/168  soft-tissue]
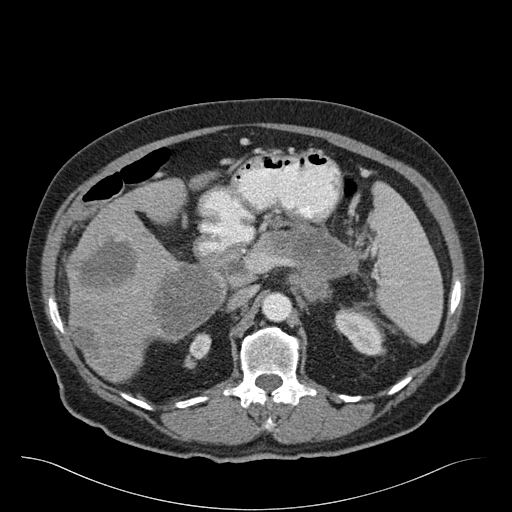
[im 116/168  lung]
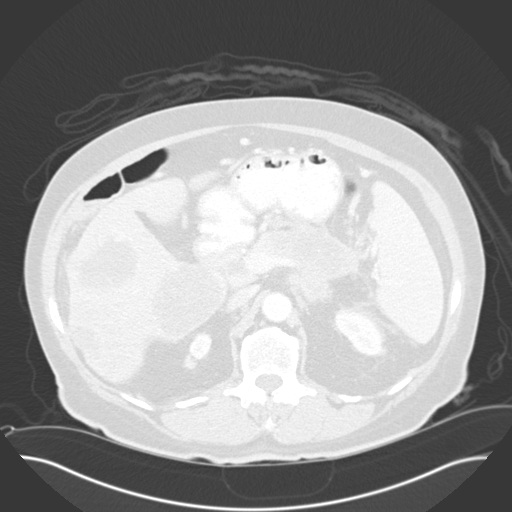
[im 116/168  bone]
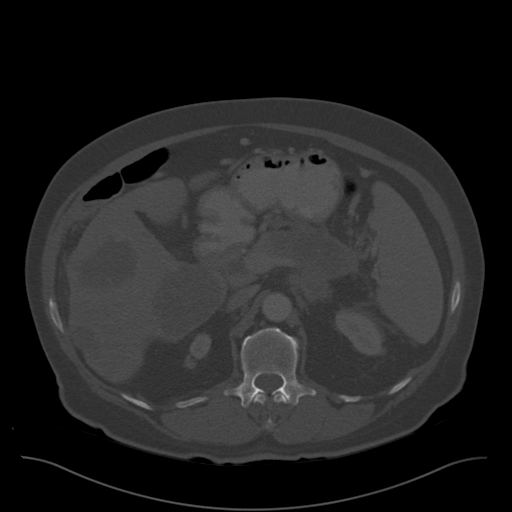
[im 129/168  soft-tissue]
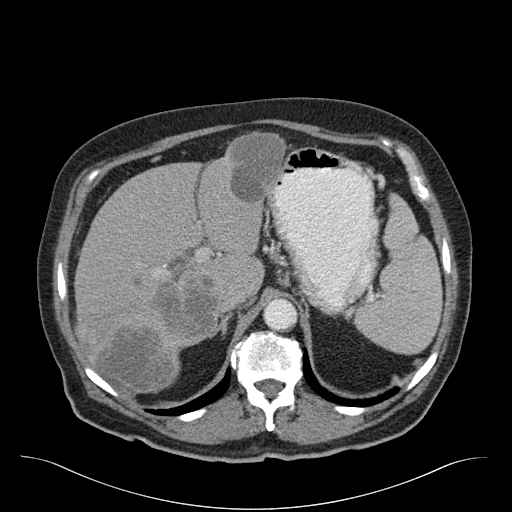
[im 129/168  lung]
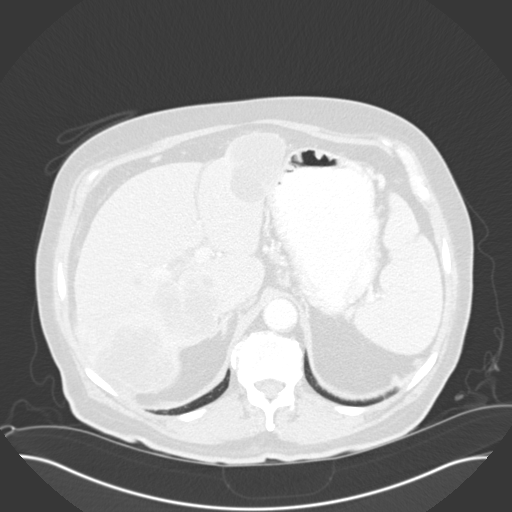
[im 142/168  soft-tissue]
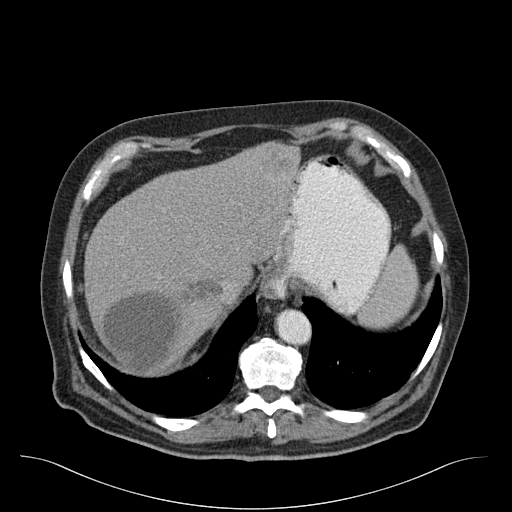
[im 142/168  lung]
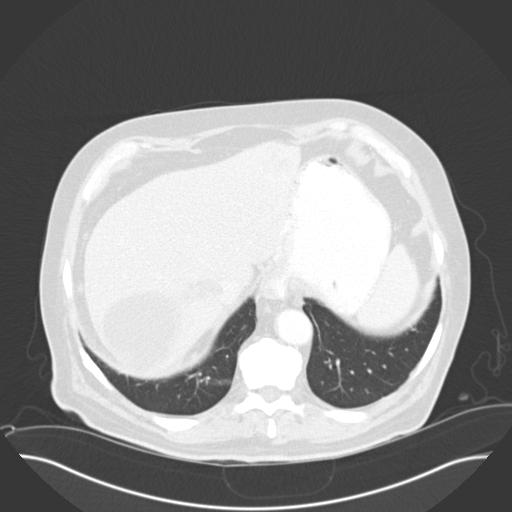
[im 155/168  soft-tissue]
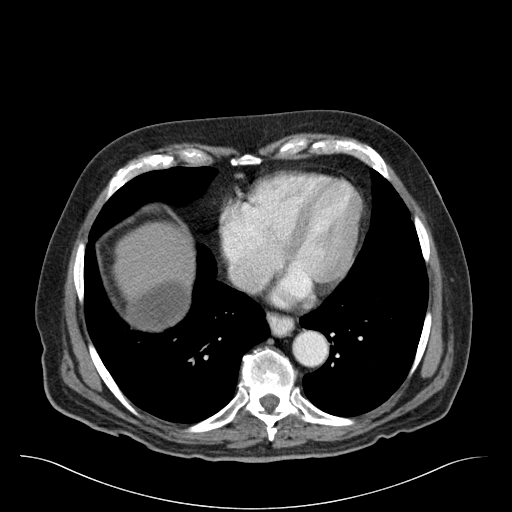
[im 155/168  lung]
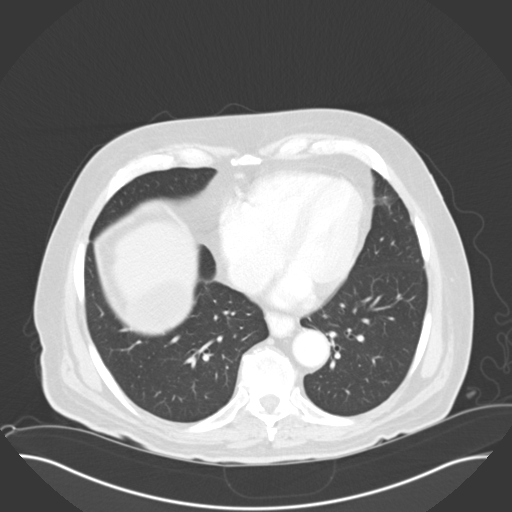

[12 of 32 positions shown; findings below may reference images not displayed]

FINDINGS: There is a indeterminate 8mm nodule in the medial right lower lobe. There
are several small nodules in the left lower lobe.

There are multiple large irregular hypoattenuating lesions within the liver.
The largest is in the inferior right hepatic lobe, measuring 5.6 x 5.2 cm.
There is low-attenuation extending along the right hepatic vein to the level
of the IVC, concerning for extension into the hepatic vein. Low-attenuation
lesions in the superior aspect of the right hepatic lobe measure water
density and could represent cysts. The spleen and right adrenal gland are
unremarkable. There is a small area of low-attenuation within the anterior
periphery of the left kidney. It is nonspecific. Possibilities would include
an area of infarction. Small low-attenuation lesions in the left kidney are
too small to characterize. The right kidney is very atrophic. There is an
irregular mass involving the body and tail of the pancreas. It appears to
extend and involve the left adrenal gland. The mass measures 7.2 x 4.6 cm.
There is a small soft tissue nodule in the region of the gall bladder fossa.

There is a trace amount of free fluid in pelvis, which is nonspecific.
Metallic beads are seen within the prostate. The small and large bowel are
normal in caliber. There is diverticulosis of the sigmoid colon. The
appendix is not visualized. There is a small area of soft tissue thickening
along the right diaphragmatic crus, which is indeterminate.

Small subcentimeter sclerotic foci in the pelvis are nonspecific, but may
resent bone islands.
IMPRESSION: 1. Large irregular mass in the pancreatic body and tail is concerning for
malignancy. It appears to extend and involve the left adrenal gland.
2. Multiple large indeterminate low-attenuation lesions in the liver are
concerning for metastatic disease. There are findings concerning for
invasion and extension into the right hepatic vein extending to the junction
with the IVC.
3. Multiple small nodules in the lung bases are indeterminate, but
concerning for metastatic disease given the aforementioned findings.
4. Small peripheral area of low-attenuation in the left kidney is
indeterminate. Possibilities would include a small infarct, among others.

[REDACTED]

## 2014-06-18 IMAGING — CT CT ASPIRATION
2 of 3 series · 11 of 16 positions shown, 13 images · non-contrast
Comparison: none

REASON FOR EXAM: multiple liver lesions
COMMENTS:

[Series 2: soft tissue · axial · 0.82mm/px · z∈[-878,-692]mm · 7 of 84 slices shown, 9 images (1 of 2)]
[im 11/84  soft-tissue]
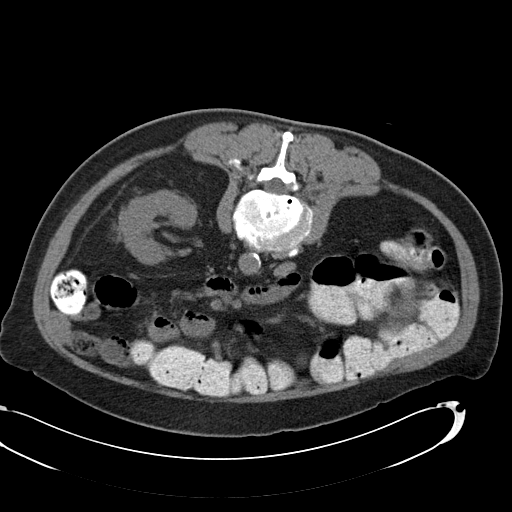
[im 11/84  bone]
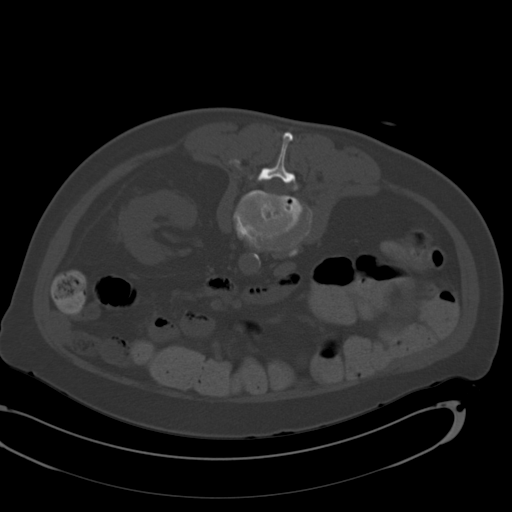
[im 21/84  soft-tissue]
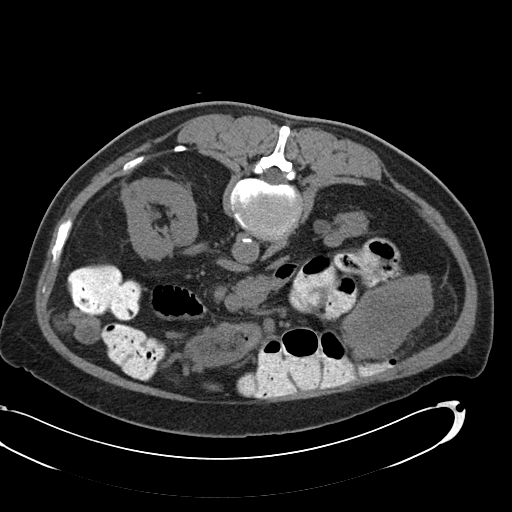
[im 32/84  soft-tissue]
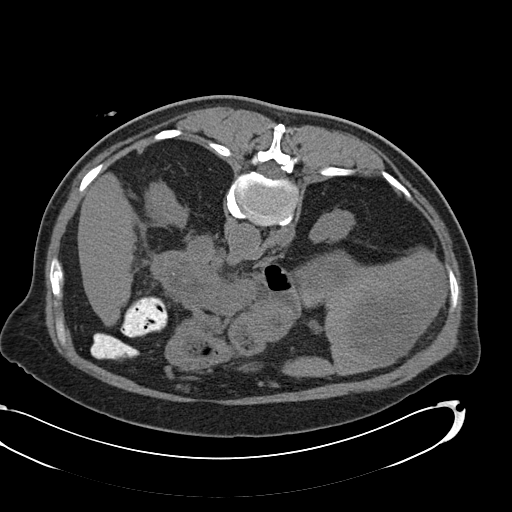
[im 42/84  soft-tissue]
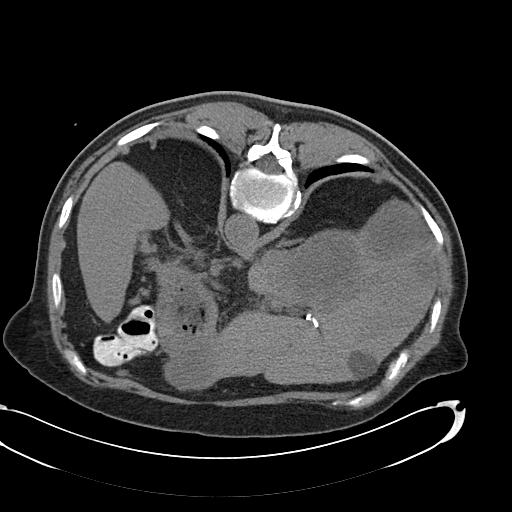
[im 52/84  soft-tissue]
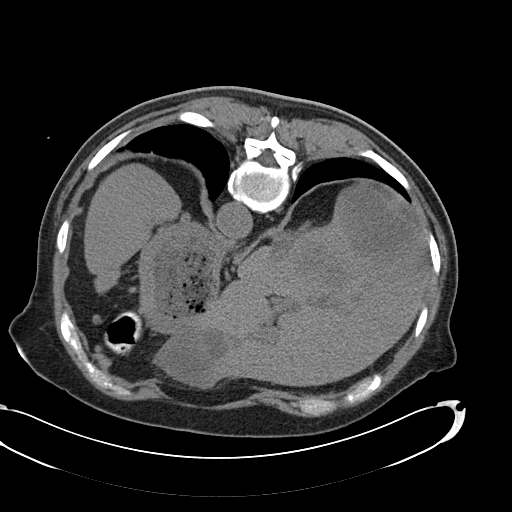
[im 52/84  bone]
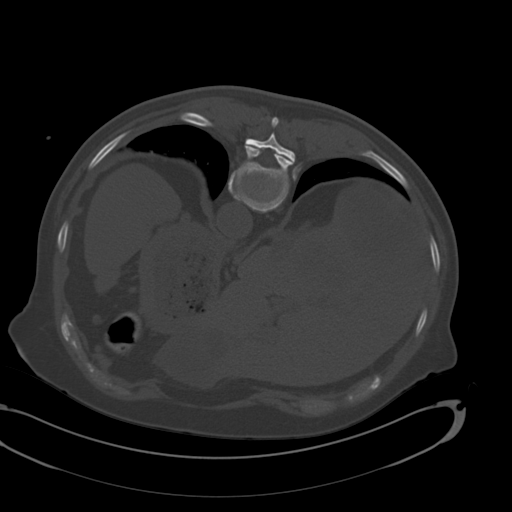
[im 63/84  soft-tissue]
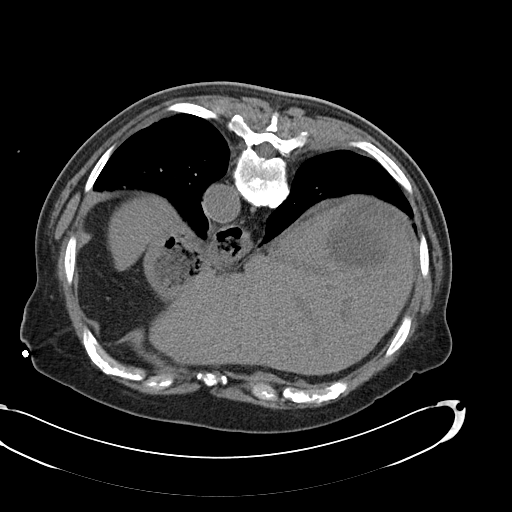
[im 73/84  soft-tissue]
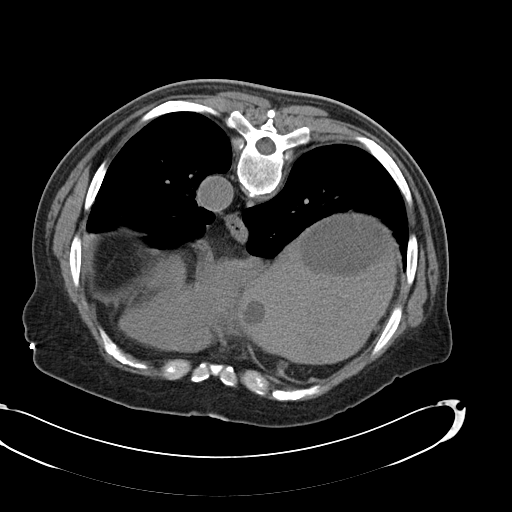

[Series 3: soft tissue · axial · 0.92mm/px · z∈[-855,-765]mm · 4 of 51 slices shown (2 of 2)]
[im 11/51  soft-tissue]
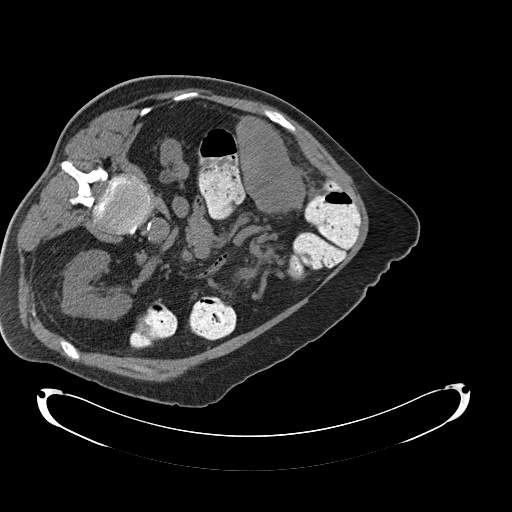
[im 21/51  soft-tissue]
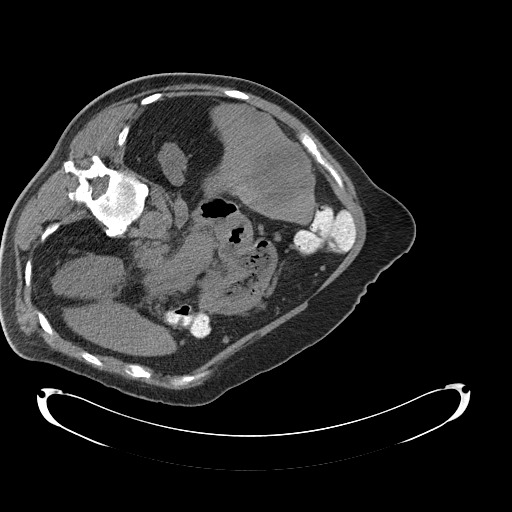
[im 31/51  soft-tissue]
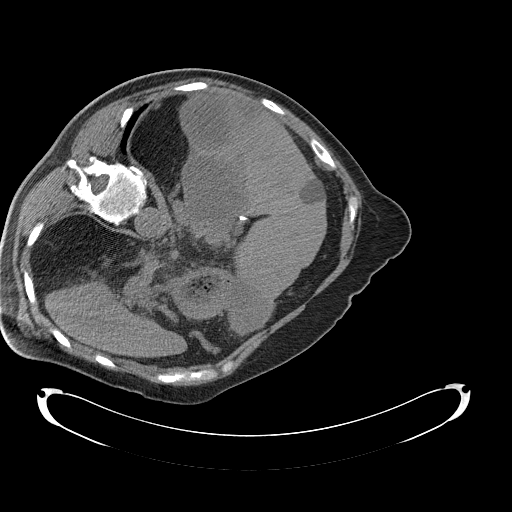
[im 41/51  soft-tissue]
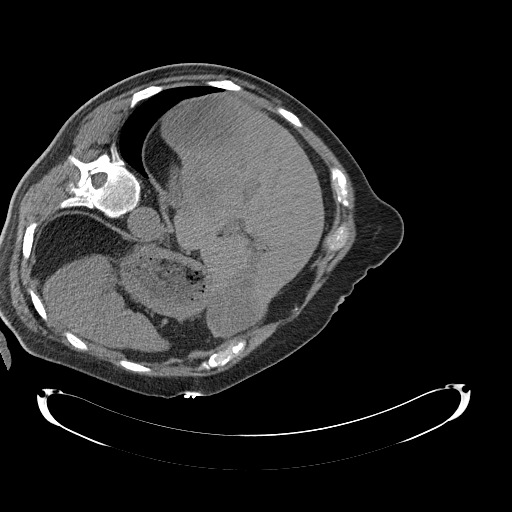

[11 of 16 positions shown; findings below may reference images not displayed]

PROCEDURE:     CT  - CT GUIDED BIOPSY or ASPIRATION  - November 23, 2012 [DATE]

RESULT:     Indication:  Multiple liver masses. Percutaneous CT-guided
biopsy is requested.

Comparisons:  None

Procedure: Clinical assessment was performed and informed consent obtained.
The patient was brought to the CT suite and placed in in right posterior
oblique on the table. A focused abdominal CT with localizing skin markers
was performed demonstrating multiple hepatic masses.  The lesion was deemed
amenable to biopsy.

The overlying skin was marked, prepped, and draped in the usual sterile
fashion. The skin and subcutaneous tissues were anesthetized with 1%
lidocaine. A 17 gauge coaxial needle was inserted through posterior
abdominal wall with the tip abutting the lesion margin under CT guidance. A
18 gauge core needle biopsy gun was inserted through the localizing sheath.
Three biopsy specimens were obtained and submitted to pathology.

The biopsy gun and outer needle were removed.  Immediate postprocedure
imaging demonstrated no immediate complications.  The patient was
transferred to the recovery unit in stable condition.

Sedation: 1 mg of midazolam and 25 mcg of fentanyl.
IMPRESSION: Uncomplicated CT-guided biopsy of a right inferior hepatic mass.

[REDACTED]

## 2014-08-22 IMAGING — CR DG CHEST 2V
1 series · 2 of 2 positions shown · non-contrast
Comparison: none

REASON FOR EXAM: sob pancreas cancer
COMMENTS:

PROCEDURE:     MDR - MDR CHEST PA(OR AP) AND LATERAL  - January 27, 2013 [DATE]
RESULT:
Comparison is made to a prior study dated 11/30/2012.

[Series 1: pa · 0.17mm/px · 2 of 2 slices shown]
[im 1/2]
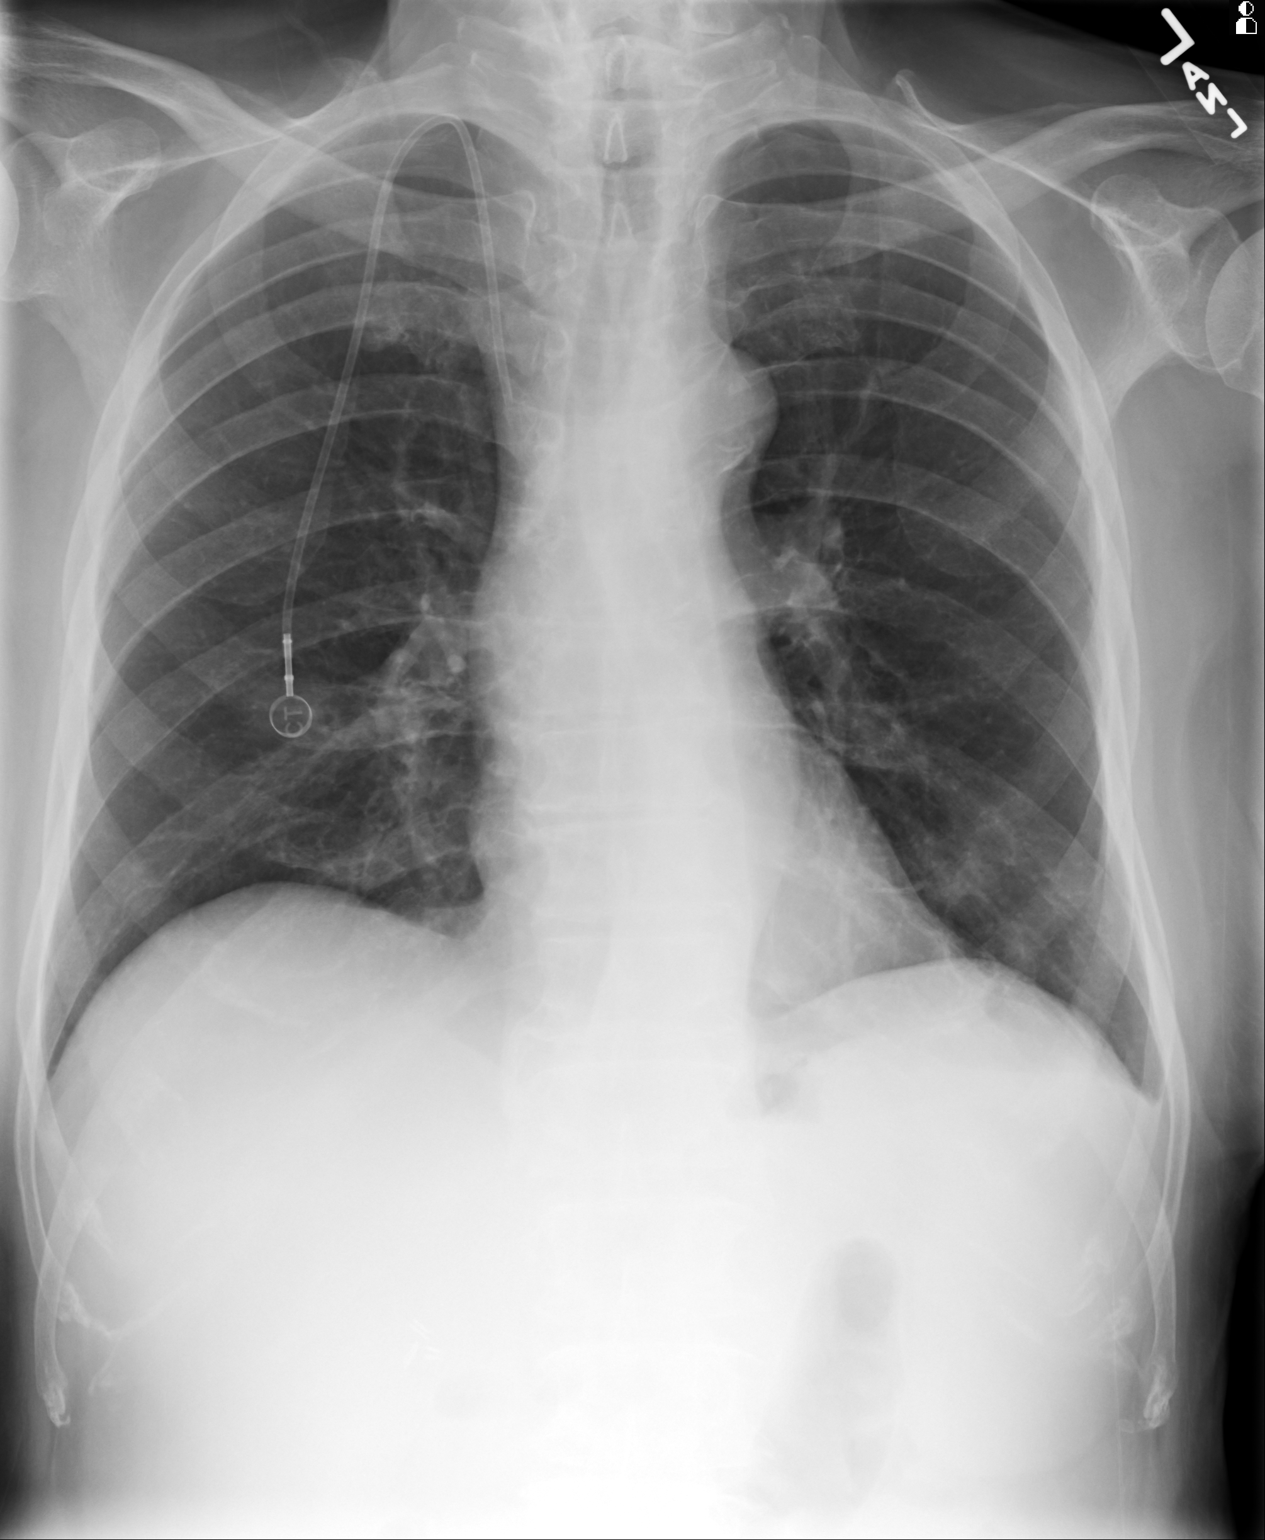
[im 2/2]
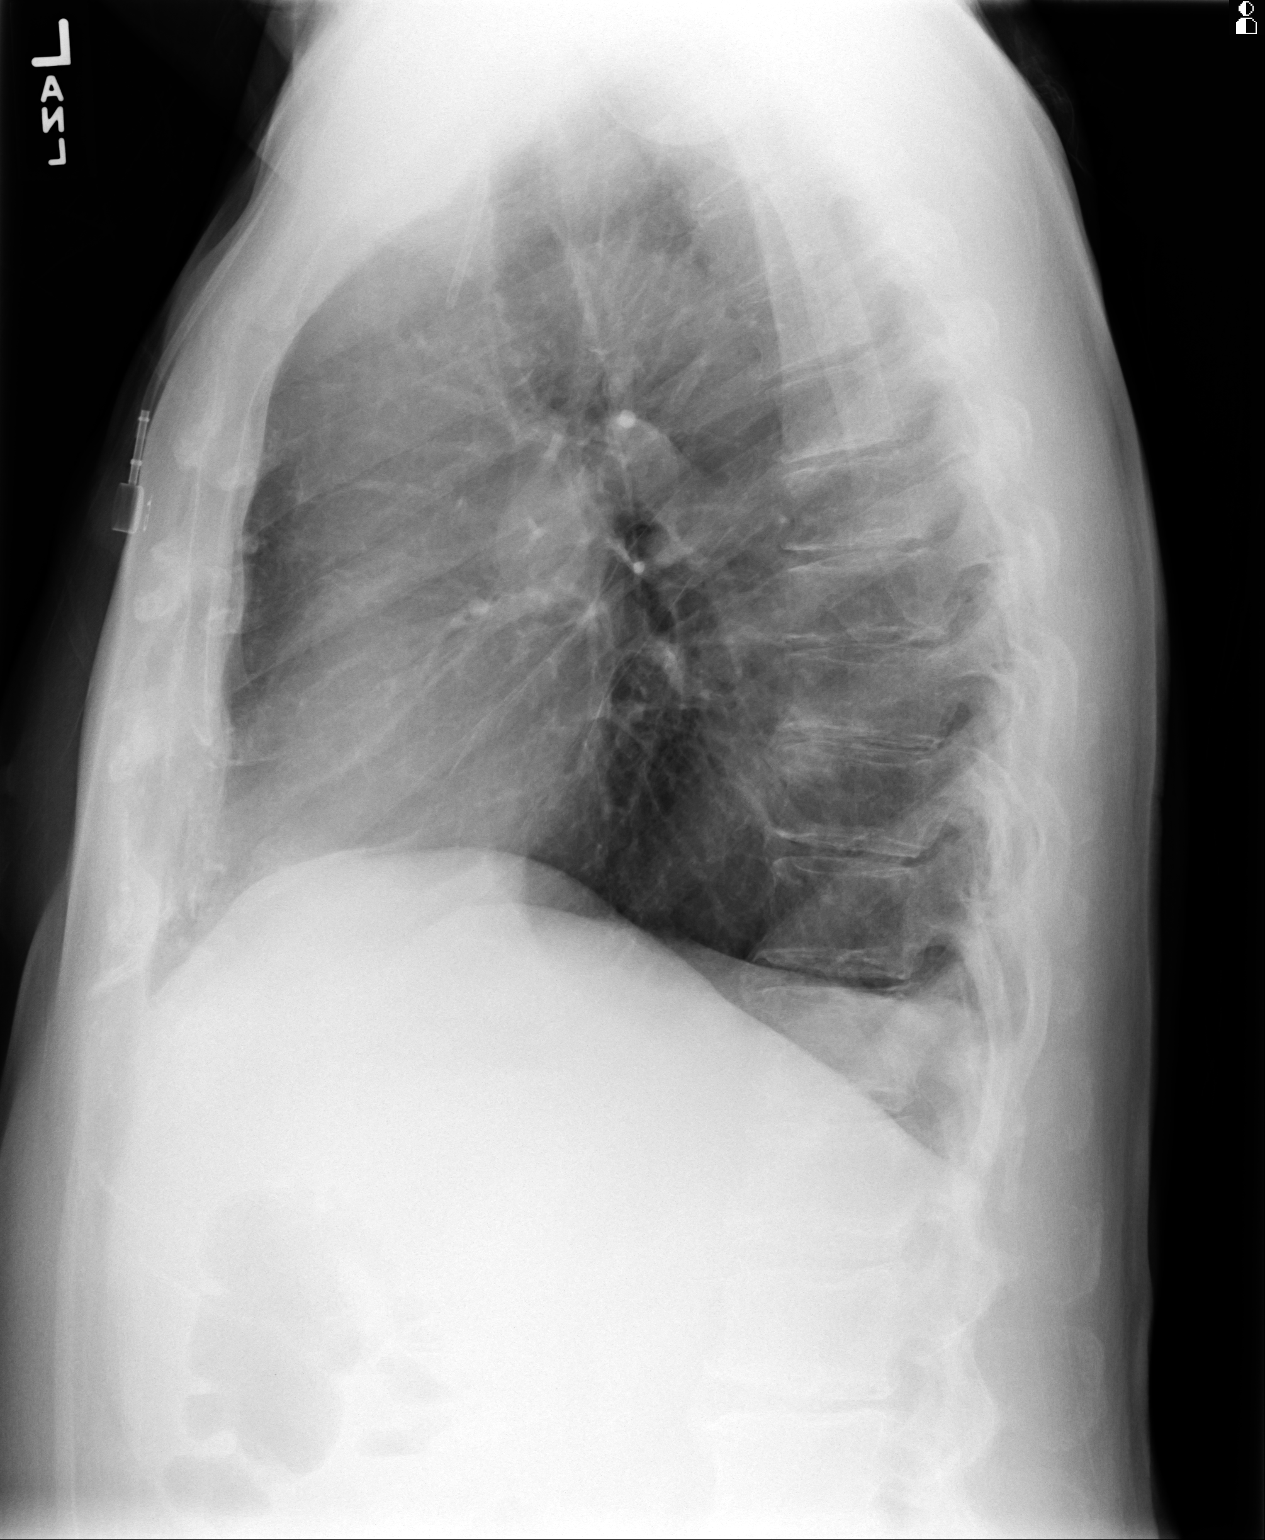

[2 of 2 positions shown; findings below may reference images not displayed]

FINDINGS: There is minimal blunting of the left costophrenic angle. There
is otherwise no evidence of focal infiltrates, effusions or edema. The
cardiac silhouette and visualized bony skeleton is unremarkable. A right
sided Port-A-Cath is appreciated with the tip projecting in the region of
the superior vena cava.
IMPRESSION: Blunting of the left costophrenic angle which may represent
pleural thickening versus trace effusion. Otherwise, no evidence of acute
cardiopulmonary disease.

## 2014-09-15 IMAGING — CR DG CHEST 1V PORT
1 series · 1 of 1 positions shown · non-contrast
Comparison: none

REASON FOR EXAM: weakness
COMMENTS:

PROCEDURE:     DXR - DXR PORTABLE CHEST SINGLE VIEW  - February 20, 2013  [DATE]
RESULT:     Comparison: 01/27/2013

[ap]
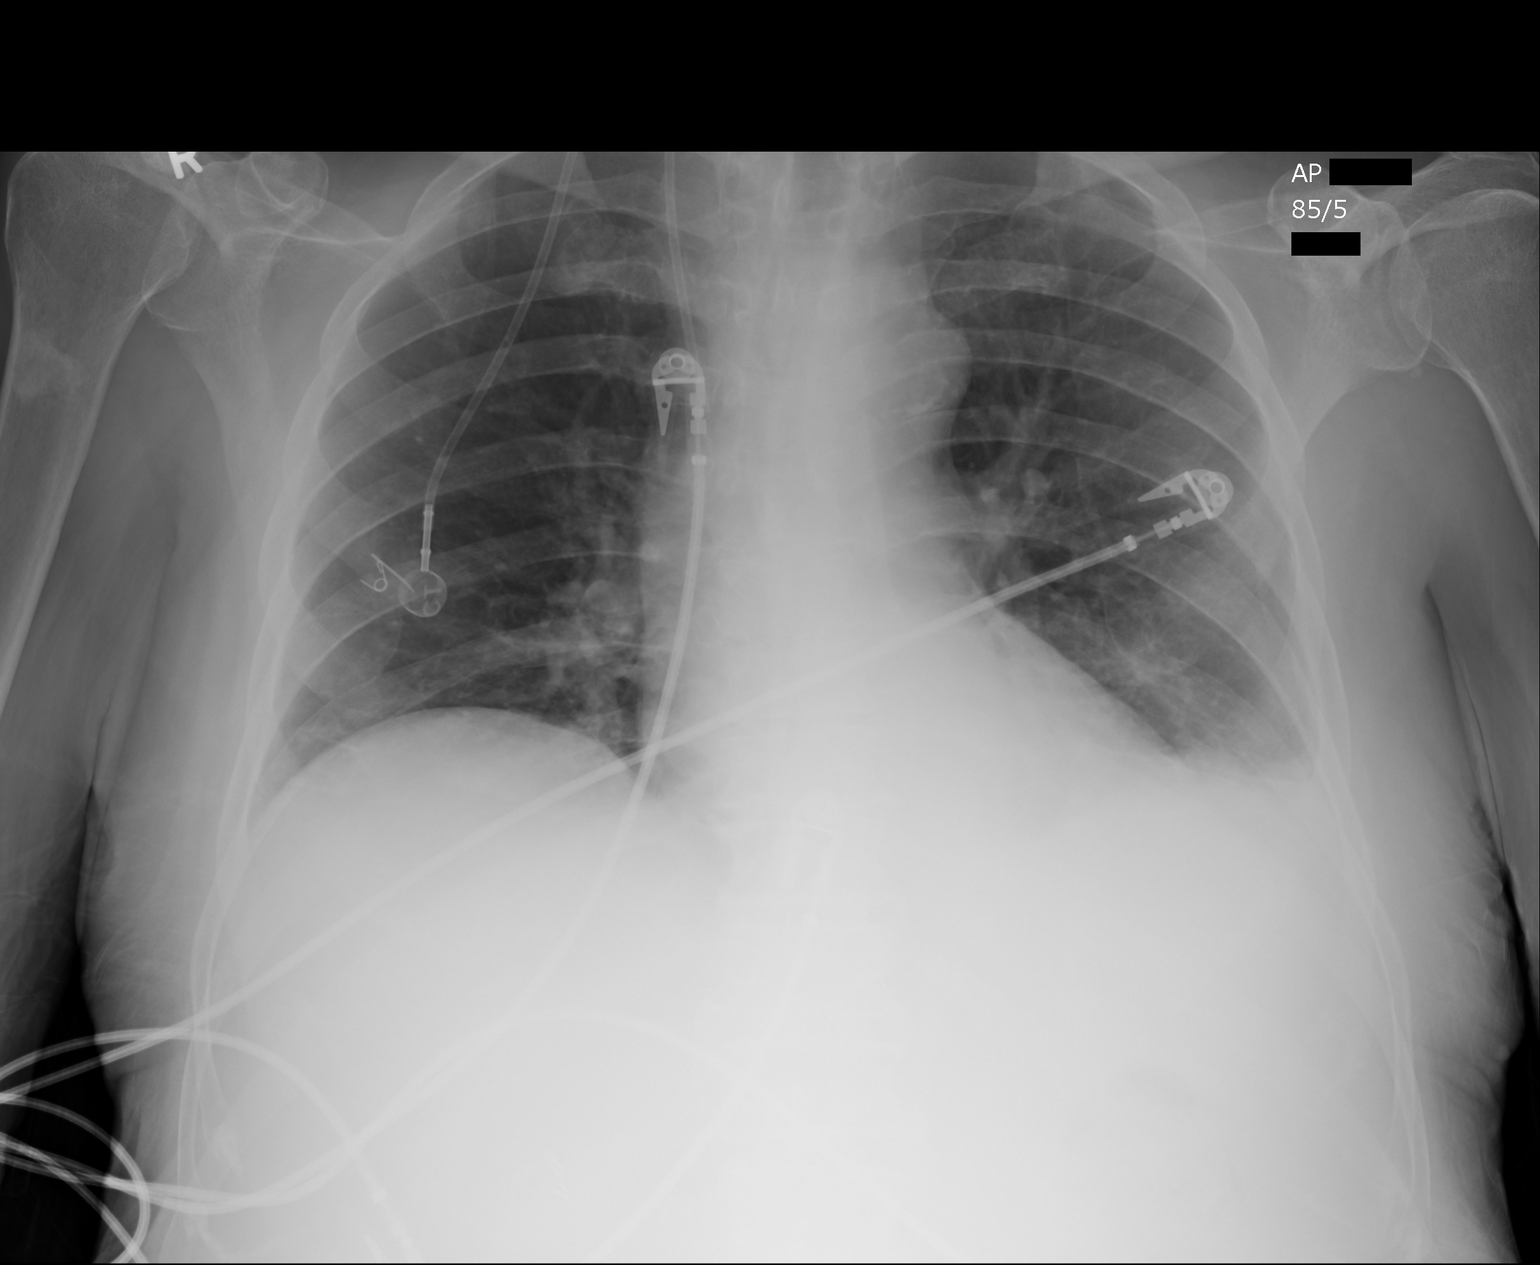

[1 of 1 positions shown; findings below may reference images not displayed]

FINDINGS: Single portable AP chest radiograph is provided. There is a small left
pleural effusion. There is left basilar airspace disease which may resent
atelectasis versus pneumonia. The lungs otherwise clear. There is no
pneumothorax. Normal cardiomediastinal silhouette. Right-sided Port-A-Cath
is again noted. The osseous structures are unremarkable.
IMPRESSION: There is a small left pleural effusion. There is left basilar airspace
disease which may resent atelectasis versus pneumonia.

[REDACTED]

## 2014-09-23 NOTE — Consult Note (Signed)
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  This was clipped with great results.  Hgb stable, no pain, slight lightheaded when gets up but walked to bathroom withou problems.  Abd not tender, no masses, no HSM.  Hgb today is 8.1, not much change from 8.5, no indication for transfusion at this time.  I think he can go home on soft diet and follow up with me in office in 2 weeks,  sooner if needed.  Discussed with Hospitalist.  Electronic Signatures: Scot JunElliott, Avonlea Sima T (MD)  (Signed on 15-Mar-14 10:36)  Authored  Last Updated: 15-Mar-14 10:36 by Scot JunElliott, Delfin Squillace T (MD)

## 2014-09-23 NOTE — Discharge Summary (Signed)
PATIENT NAME:  Jeffery SersDAMS, Lannis F MR#:  [id number] DATE OF BIRTH:  05-04-1936  DATE OF ADMISSION:  12/09/2012 DATE OF DISCHARGE:  12/12/2012  FINAL DIAGNOSES: 1.  Chemotherapy-induced nausea and vomiting.  2.  Prerenal azotemia from vomiting.  3.  Back and abdominal pain from pancreatic cancer.  4.  Underlying pancreatic cancer metastatic to liver.  5.  Hypomagnesemia due to chemotherapy.   PROCEDURES: The patient had Neulasta on July 11, which is part of his initially planned chemotherapy.   INITIAL HISTORY AND PHYSICIAL: Dictated on July 9.    HOSPITAL COURSE: As in the admission history and physical, the patient was initially brought in and given intravenous fluids initially 150 mL an hour and given Zofran and Ativan p.r.n. for nausea. Duragesic was placed and increased for pain control and morphine was given IV p.r.n. initially. IV Toradol was given initially. The patient had a 5-FU pump for chemotherapy which was continued. The patient continued with IV fluids. Later morphine was changed over to p.o. oxycodone and long-acting Duragesic was titrated upward. Diet was advanced as nausea was better and was better and vomiting improved. He was still given IV Zofran during hospitalization. On July 12, his pain was really under good control and nausea was minimal. He was tolerating p.o. Magnesium had come down and creatinine and potassium were good. The patient was stable for discharge home on increasing fentanyl. OxyContin was stopped. Oxycodone was used for breakthrough. IV magnesium was given prior to discharge and oral magnesium oxide was started at the time of discharge. He was given a follow-up appointment on July 15.    ____________________________ Knute Neuobert G. Lorre NickGittin, MD rgg:cc D: 01/05/2013 13:25:13 ET T: 01/05/2013 15:03:42 ET JOB#: [id number]  cc: Knute Neuobert G. Lorre NickGittin, MD, <Dictator> Marin RobertsOBERT G Bastien Strawser MD ELECTRONICALLY SIGNED 01/26/2013 14:38

## 2014-09-23 NOTE — Op Note (Signed)
PATIENT NAME:  Jeffery Cunningham, Jeffery Cunningham MR#:  [id number] DATE OF BIRTH:  [birth year]  DATE OF PROCEDURE:  11/30/2012  SURGEON:  Marcial Pacasimothy E. Thelma Bargeaks, MD   ASSISTANT:  None.   PREOPERATIVE DIAGNOSIS:  Carcinoma of the liver.   POSTOPERATIVE DIAGNOSIS:  Carcinoma of the liver.   OPERATION PERFORMED:  Insertion of Port-A-Cath using ultrasound guidance.   INDICATIONS FOR PROCEDURE:  Jeffery Cunningham is a 79 year old gentleman who was recently found to have metastatic carcinoma in his liver. He was apprised of the indications and risks for chemotherapy and a Port-A-Cath was required for intravenous administration of chemotherapeutic agents. The indications and risks of Port-A-Cath placement were explained to the patient who gave his informed consent.   DESCRIPTION OF PROCEDURE:  The patient was brought to the operating suite and placed in the supine position. Laryngeal mask airway anesthesia was used as well as 0.25% Marcaine for local anesthesia. Once the patient was positioned properly on the table and anesthesia given, the patient was prepped and draped in the usual sterile fashion.   We began by using the ultrasound to identify the right internal jugular vein which was percutaneously catheterized. A wire was placed through the catheter into the right side of the heart and verified under fluoroscopic guidance. We then created our port in the anterior aspect of the chest wall and placed the catheter through the port site up to the entrance of the wire into the neck. The catheter was then placed through a peel-away sheath and positioned into the superior vena cava right atrial junction. There was good flow and the catheter flushed nicely. It was then trimmed to appropriate length and the catheter was assembled to the port. The port was then secured with four 2-0 Prolene sutures to the anterior chest wall. The catheter was again flushed and verified to be in proper position under fluoroscopy.   The wounds were then closed  with 3-0 Vicryl in the subcutaneous tissues and nylon on the skin.   The patient tolerated the procedure well and was taken to the recovery room in stable condition.    ____________________________ Sheppard Plumberimothy E. Thelma Bargeaks, MD teo:si D: 11/30/2012 19:54:29 ET T: 11/30/2012 21:37:16 ET JOB#: [id number]  cc: Marcial Pacasimothy E. Thelma Bargeaks, MD, <Dictator> Jasmine DecemberIMOTHY E Dayzha Pogosyan MD ELECTRONICALLY SIGNED 12/02/2012 14:48

## 2014-09-23 NOTE — H&P (Signed)
PATIENT NAME:  Jeffery Cunningham, Jeffery Cunningham MR#:  [id number] DATE OF BIRTH:  10-01-1935  DATE OF ADMISSION:  08/13/2012  PRIMARY CARE PHYSICIAN: Dr. Mila Merry.   CHIEF COMPLAINT: Shortness of breath and dizziness.   HISTORY OF PRESENT ILLNESS: This is a 79 year old male, who presents to the hospital directly being admitted from the [facility]Ps office due to dizziness and shortness of breath. The patient was also noted to be anemic with a hemoglobin of 7.2 at primary care physician's office. His hemoglobin in February of this past year was 9.2. He is being admitted for workup and treatment of symptomatic anemia. The patient denies any melena, any hematochezia, hematuria or any evidence of an acute blood loss. He did have a Hemoccult checked at the PCPs office, which was positive, although he denies seeing any blood. He denies any chest pain, denies any palpitations. He denies any fevers, chills, abdominal pain, diarrhea, or any other associated symptoms. Due to his profound drop in his hemoglobin and him being symptomatic, he is being admitted for further treatment and evaluation.   REVIEW OF SYSTEMS:   CONSTITUTIONAL: No documented fever. No weight gain. No weight loss.  EYES: No blurry or double vision.  ENT: No tinnitus. No postnasal drip. No redness of the oropharynx.  RESPIRATORY: No cough. No wheeze. No hemoptysis. Positive dyspnea.  CARDIOVASCULAR: No chest pain. No orthopnea. No palpitations. No syncope.  GASTROINTESTINAL: No nausea. No vomiting. No diarrhea. No abdominal pain. No melena. No hematochezia.  GENITOURINARY: No dysuria. No hematuria.  ENDOCRINE: No polyuria or nocturia. No heat or cold intolerance.  HEMATOLOGIC: Positive anemia. No acute bruising or bleeding.  INTEGUMENTARY: No rashes. No lesions.  MUSCULOSKELETAL: No arthritis. No swelling. No gout.  NEUROLOGIC: No numbness or tingling. No ataxia. No seizure-type activity.  PSYCHIATRIC: No anxiety. No insomnia. No ADD.  PAST MEDICAL  HISTORY: Consistent with hypertension, diet controlled diabetes, hyperlipidemia and glaucoma. History of prostate cancer.   ALLERGIES: No known drug allergies.   SOCIAL HISTORY: No smoking. No alcohol abuse. No illicit drug abuse. Lives at home with his wife.   FAMILY HISTORY: Mother died  when he was age 48 from meningitis. Father died from lung cancer.   CURRENT MEDICATIONS: As follows: Multivitamin daily, simvastatin 20 mg daily, Travatan 1 drop to both eyes at bedtime, Ambien 10 mg at bedtime as needed, aspirin 81 mg daily, which he has held for the past 2 weeks.   PHYSICAL EXAMINATION: Presently is as follows:  VITAL SIGNS: Temperature 97.6, pulse 95, respirations 17, blood pressure 138/87, 99% on room air.  GENERAL: He is a pleasant appearing male in no apparent distress.  HEENT: Atraumatic, normocephalic. Extraocular muscles are intact. Pupils equal, reactive to light. Sclerae anicteric. No conjunctival injection. No pharyngeal erythema.  NECK: Supple. No jugular venous distention. No bruits. No lymphadenopathy. No thyromegaly.  HEART: Regular rate and rhythm. No murmurs. No rubs. No clicks.  LUNGS: Clear to auscultation bilaterally. No rales, rhonchi or wheezes.  ABDOMEN: Soft, flat, nontender, nondistended. Has good bowel sounds. No hepatosplenomegaly appreciated.  EXTREMITIES: No evidence of any cyanosis, clubbing or peripheral edema. Has +2 pedal and radial pulses bilaterally.  NEUROLOGICAL: The patient is alert, awake, and oriented x 3 with no focal motor or sensory deficits appreciated bilaterally.  SKIN: Moist and warm with no rash appreciated.  LYMPHATIC: There is no cervical or axillary lymphadenopathy.   LABORATORY EXAM: Showed a serum glucose of 206, BUN 20, creatinine 1.5, sodium 138, potassium 5.3, chloride 110 and bicarbonate  24. LFTs are within normal limits. White cell count 5.1, hemoglobin 7, hematocrit 22.3, platelet count of 194.   ASSESSMENT AND PLAN: This is a  [age] year old male with a history of hyperlipidemia, chronic anemia, hypertension, diet controlled diabetes, who presents to the hospital with shortness of breath and dizziness and likely with symptomatic anemia.  1.  Anemia. This is a normocytic anemia. The exact etiology of this is unclear. It was suspected to be acute on chronic anemia. Hemoglobin was 9.2 in February, now down to 7. He is asymptomatic with it given the fact that he is short of breath and dizzy. I will go ahead and transfuse him 1 unit of packed red blood cells. Follow serial hemoglobin. I will check iron, B12, folate and ferritin levels. He was Hemoccult-positive at his primary care physician's office; therefore, I will get a gastroenterology consult.  2.  Acute gastrointestinal bleed. Likely diagnosis given anemia with Hemoccult-positive stools. His last colonoscopy was over 6 years ago, which was essentially benign. I will consult gastroenterology again given his profound anemia and Hemoccult-positive stools. Hold his aspirin for now. Transfuse 1 unit of packed red blood cells. Follow serial hemoglobins.  3.  Acute renal failure. This is likely in the setting of anemia and volume loss. We will go ahead and transfuse him 1 unit of packed red blood cells. Follow his BUN and creatinine and urine output. Renal dose medications. Avoid nephrotoxins. 4.  Hyperkalemia. This is mild and likely related to the acute renal failure. I will follow his potassium as his renal function improves.  5.  Hyperlipidemia. Continue simvastatin.  6.  Diabetes. As per the patient, this is diet-controlled. His random blood sugar is greater than 200. I will place him on sliding scale insulin coverage for now. Check hemoglobin A1c and place him on a carbohydrate-controlled diet.   CODE STATUS: The patient is a full code.   TIME SPENT ON ADMISSION: 50 minutes.   ____________________________ Rolly PancakeVivek J. Cherlynn KaiserSainani, MD vjs:aw D: 08/13/2012 11:29:34 ET T: 08/13/2012  12:51:07 ET JOB#: [id number]  cc: Rolly PancakeVivek J. Cherlynn KaiserSainani, MD, <Dictator> Houston SirenVIVEK J SAINANI MD ELECTRONICALLY SIGNED 08/17/2012 8:17

## 2014-09-23 NOTE — Op Note (Signed)
PATIENT NAME:  Jeffery Cunningham, Jeffery Cunningham MR#:  [id number] DATE OF BIRTH:  06/10/1935  DATE OF PROCEDURE:  08/04/2012  PREOPERATIVE DIAGNOSIS: Adenocarcinoma of the prostate.   POSTOPERATIVE DIAGNOSIS: Adenocarcinoma of the prostate.   PROCEDURE: Prostate volume study.   SURGEONS: Dr. Assunta GamblesBrian Cope, Dr. Carmina MillerGlenn Chrystal   ANESTHESIA: None.   INDICATIONS: The patient is a 79 year old white gentleman recently diagnosed with adenocarcinoma of the prostate. He has elected to proceed with prostate brachytherapy as treatment for prostate cancer. He presents for volume study in preparation for prostate brachytherapy.   DESCRIPTION OF PROCEDURE: After informed consent was obtained, the patient was taken to the operating room and placed in the dorsal lithotomy position. He was prepped in the standard fashion. A 16-French Foley catheter was placed to gravity drainage without difficulty. Minimal urine return was noted. Transrectal ultrasound was performed in the standard fashion. The ultrasound probe was affixed to the fixation arm. The base of the prostate was identified. Volume incremental measurements were made from the base of the prostate to the apex. Once this was completed, the transrectal ultrasound probe was removed. A small amount of additional urine was drained from the bladder. The Foley catheter was removed. The patient was returned to the supine position. [facility] in [facility] condition. There were no problems or complications. The patient tolerated the procedure well.   ____________________________ Madolyn FriezeBrian S. Achilles Dunkope, MD bsc:cb D: 08/04/2012 08:09:00 ET T: 08/04/2012 10:32:51 ET JOB#: [id number]  cc: Madolyn FriezeBrian S. Achilles Dunkope, MD, <Dictator> Madolyn FriezeBRIAN S COPE MD ELECTRONICALLY SIGNED 08/04/2012 17:10

## 2014-09-23 NOTE — Discharge Summary (Signed)
PATIENT NAME:  Jeffery Cunningham, Jeffery Cunningham MR#:  [id number] DATE OF BIRTH:  07-Sep-1935  For a detailed note, please review the history and physical done on admission.   DIAGNOSES AT DISCHARGE: As follows:  Gastrointestinal bleed, likely upper in nature, secondary to a Dieulafoy ulcer, anemia, secondary to the gastrointestinal bleed, and also iron-deficient. Hypertension. Diet-controlled diabetes. Glaucoma.   The patient is being discharged on a mechanical soft, low-sodium, low-fat, carb-controlled diet.   ACTIVITY: As tolerated.   FOLLOWUP: With Dr. Mechele CollinElliott in the next 1 to 2 weeks, and also follow up with Dr. Mila Merryonald Fisher in one in the next 1 to 2 weeks.   DISCHARGE MEDICATIONS: Are as follows: Omeprazole 40 mg daily, iron sulfate 325 mg b.i.d., Ambien 10 mg at bedtime as needed, Flomax 0.4 mg daily, multivitamin daily, Travatan 0.04% eye drops 1 drop daily, simvastatin 20 mg daily, and benazepril 5 mg daily.   CONSULTANTS DURING THE HOSPITAL COURSE: Dr. Lynnae Prudeobert Elliott, from Gastroenterology.   PERTINENT STUDIES DONE DURING THE HOSPITAL COURSE: Upper GI endoscopy done on 08/14/2012 showing grade A reflux esophagitis, red blood in the gastric body. Fluid aspiration performed. Dieulafoy lesion in the stomach, which was clipped.   HOSPITAL COURSE: This is a 79 year old male with medical problems as mentioned above, presented to the hospital on 08/13/2012 due to shortness of breath and dizziness, and symptomatic anemia.  1.  Upper GI bleed: This was likely the cause of patient's anemia, leading to his shortness of breath and dizziness. The likely source of the GI bleed was a Dieulafoy ulcer that was noted on the endoscopy done by Dr. Mechele CollinElliott. This also was acutely bleeding and therefore was clipped. After having the procedure done the patient has been off his aspirin, his hemoglobin has remained stable and he has had no further bleeding. He is presently being discharged on a soft diet with close followup with GI  as an outpatient. He was not on a proton pump inhibitor, therefore that is  being initiated for the patient.  2.  Anemia: This is likely acute blood loss anemia secondary to a GI bleed, also underlying iron deficiency. The patient's iron indices showed a lower ferritin level and low iron level. The endoscopy done did show an ulcer that was bleeding, which was clipped. The patient did receive 1 unit of packed red blood cells while in the hospital. His hemoglobin has improved since then. It has now stabilized. He is being taken off his aspirin presently. He is being discharged on a proton pump inhibitor, as stated, and close followup with GI as an outpatient.  3.  Acute renal failure: This was likely secondary to volume loss from the anemia. After getting his blood transfusions and some IV fluids his creatinine has now come back to baseline.  4.  Hyperkalemia: This was likely also in the setting of acute renal failure and has also improved and now resolved with IV fluids and transfusion.  5.  Hyperlipidemia: The patient was maintained on simvastatin. He will resume that.  6.  Diabetes: This is diet-controlled. The patient's hemoglobin A1c was less than 3.5. His sugars  remain stable.  7.  Benign prostatic hypertrophy: The patient must continue his Flomax, as stated. There was no evidence of any urinary obstruction.   CODE STATUS: The patient is a FULL CODE.   Time spent: 40 minutes     ____________________________ Rolly PancakeVivek J. Cherlynn KaiserSainani, MD vjs:dm D: 08/15/2012 14:05:00 ET T: 08/16/2012 08:53:29 ET JOB#: [id number]  cc: Molly Maduroobert T.  Mechele Collin, MD Demetrios Isaacs Sherrie Mustache, MD Rolly Pancake. Cherlynn Kaiser, MD, <Dictator>  [place] Siren MD ELECTRONICALLY SIGNED 08/17/2012 8:18

## 2014-09-23 NOTE — Consult Note (Signed)
PATIENT NAME:  Jeffery Cunningham, REASONS MR#:  [id number] DATE OF BIRTH:  1936-04-14  DATE OF CONSULTATION:  08/13/2012  REFERRING PHYSICIAN:  Abel Presto, MD CONSULTING PHYSICIAN:  Gaylyn Cheers, MD / Corky Sox. Earle, PA-C  REASON FOR CONSULTATION:  Shortness of breath with heme-positive stools.   HISTORY OF PRESENT ILLNESS:  This is a pleasant 79 year old gentleman who initially presented to the hospital with concerns of increasing shortness of breath and a decline in hemoglobin who was found to have heme-positive stools at his primary care physician's office. His hemoglobin was last checked in January of 2014 where it was 9.2 and upon initial presentation to the hospital it 7.0. He is currently undergoing a single unit of packed red blood cells transfusion. He denies noticing any gross blood per rectum, denying any bright red blood per rectum or melena. There is no nausea, vomiting or hematemesis. He denies any abdominal pain. His only symptomatic concern and complaint was shortness of breath and fatigue. Of note, he was recently diagnosed with prostate cancer, in October of 2013. At home when he began feeling weak he took at-home stool guaiac tests x 4, all of which were negative. He then saw his primary care physician who did a rectal exam and was noted to be heme-positive. He states that the only thing that had changed prior to that rectal exam was that he did undergo a prostate biopsy. He does state that he has plans to get a prostate seed implanted April 1st of next month, 2014, for his prostate cancer and he is established with the [place] at [facility]. He is already feeling better since being transfused. He does state that his last colonoscopy was approximately 6 years ago where he was told he had a benign polyp and did not need a repeat for another 10 years. This was performed by Dr. Allen Norris.  He is unsure if he has ever had an EGD.   ALLERGIES: No known drug allergies.   PAST MEDICAL HISTORY:  Hypertension, diabetes mellitus that is diet controlled, dyslipidemia, glaucoma, and newly diagnosed prostate cancer in October of 2013.   PAST SURGICAL HISTORY: Back surgery in 1992, cataract surgery in 2012, gallbladder surgery in 1982.   SOCIAL HISTORY: The patient denies any tobacco or illicit drug use, however, he does report social alcohol use, but denies anything in excess. He also reports that he takes daily NSAIDs for chronic back pain. He takes ibuprofen twice daily.   FAMILY HISTORY: Father had lung cancer. No other GI malignancy or colon polyps noted. No IBD.   HOME MEDICATIONS: Multivitamin, simvastatin, Travatan, Ambien, and a baby aspirin, which he has been holding his baby aspirin for the past 2 weeks.   REVIEW OF SYSTEMS:  A 12 system review was obtained on the patient. All pertinent positives are mentioned above and otherwise negative.   PHYSICAL EXAMINATION:  VITAL SIGNS: Blood pressure 130/84, heart rate 77, respirations 17, temperature 98, bedside pulse ox 100%. GENERAL: This is a pleasant 79 year old gentleman resting quietly and comfortably in bed, alert and oriented x 3, in no acute distress.  HEAD: Atraumatic, normocephalic.  NECK: Supple. No lymphadenopathy.  HENT: Sclerae anicteric. Mucous membranes moist.  LUNGS:  Respirations are even and unlabored, clear to auscultation in bilateral anterior lung fields. HEART: Regular rate and rhythm. S1 and S2 noted. ABDOMEN: Soft, nontender, and nondistended. Normoactive bowel sounds noted in all 4 quadrants. No masses palpated. No guarding or rebound.  RECTAL: Deferred as he just recently had  one that was heme-positive.  EXTREMITIES: Negative for lower extremity edema. 2+ pulses noted bilaterally. NEUROLOGIC: Cranial nerves II through XII grossly intact. Appropriate mood and affect, alert and oriented x 3.   LABORATORY DATA: White blood cells 5.1, hemoglobin 7 down from 9.2, hematocrit 22.3, platelets 194. Sodium 138,  potassium 5.3, BUN 20, creatinine 1.55, glucose 206. INR 1.1. PT 14.1. Bilirubin 0.3, alk phos 100, ALT 24, AST 27, albumin 3.9.   ASSESSMENT: 1.  Normocytic anemia, from 9.2 down to 7.0.  2.  Increasing fatigue and shortness of breath likely secondary to above anemia.  3.  Heme-positive stools.  4.  Newly diagnosed with prostate cancer in October of 2013.   PLAN: I have discussed this patient's case in detail with Dr. Gaylyn Cheers who is involved in the development of the patient's plan of care. At this time, we do recommend obtaining iron studies with his labs that were drawn prior to his transfusion. Due to his heme-positive stools and history of daily NSAID use, we do recommend the patient undergo an upper endoscopy to rule out peptic ulcer disease and any source of upper gastrointestinal bleeding. We do also recommend continuing to monitor his hemoglobin and hematocrit regularly and being prepared to transfuse as necessary. It is certainly likely that his heme-positive stools were secondary to a recent prostate biopsy that he had had just prior to that rectal exam. He is scheduled to get a seed implanted April 1st of 2014 so ideally should he remain stable we can have him proceed with this procedure and see [place] in the office as an outpatient to undergo an outpatient colonoscopy as it has been 6 years since his last one and he has had a diagnosis of cancer in the interim. The above plan of care was discussed and agreed upon with the patient who verbalized understanding. Alternatives, risks and benefits were discussed and all questions were answered.   Thank you so much for this consultation and for allowing [place] to participate in the patient's plan of care. We will continue to monitor him and make further recommendations pending above and per clinical course.   The above was discussed and agreed upon under supervisoratory agreement between myself and Dr. Gaylyn Cheers.   ____________________________ Corky Sox. Earle, PA-C kme:sb D: 08/14/2012 11:33:02 ET T: 08/14/2012 12:16:20 ET JOB#: [id number]  cc: Corky Sox. Earle, PA-C, <Dictator> [place] PA ELECTRONICALLY SIGNED 08/14/2012 15:18

## 2014-09-23 NOTE — Discharge Summary (Signed)
PATIENT NAME:  Jeffery Cunningham, Jeffery Cunningham MR#:  [id number] DATE OF BIRTH:  04/22/1936  DATE OF ADMISSION:  02/20/2013 DATE OF DISCHARGE:  02/22/2013  DISPOSITION:  To home.  CONSULTATIONS:  Oncology, Dr. Janese BanksSandeep Pandit (,covering for Dr.Gittins  DISCHARGE DIAGNOSES: 1.  Nausea and vomiting secondary to chemotherapy.  2.  Failure to thrive.n 3.  Metastatic pancreatic cancer.  4.  History of deep vein thrombosis.  5.  Hypertension.   CODE STATUS:  DO NOT RESUSCITATE. Discharge home with hospice.   HOSPITAL COURSE: The patient is a 79 year old male with history of metastatic pancreatic cancer, history of DVT, followed by Dr. Lorre NickGittin. Had  chemotherapy on September 16th.  Since then, the patient had progressive weakness and also had nausea, not eating much and drinking. Came in here because of generalized weakness. The patient's lab data was unremarkable on admission, and admitting doctor started on IV fluids and also IV nausea medication. While in there, the patient was seen by Dr. Sherrlyn HockPandit and started on Decadron for improving his appetite and failure to thrive issues. For his back pain, oxycodone changed from 10 to 20 mg q.4 hours, and he is also on fentanyl patch. That will be continued. The patient wanted the hospice at home, so we kept the patient in  the hospitalm to help w ith that as well as for his nausea,vomting,> He was then discharged home with hospice, and pain medications, but Dr. Sherrlyn HockPandit said chemotherapy will not help further.  The patient does have lower extremity DVT history, and he is on Lovenox full dose.   DISCHARGE MEDICATIONS: Include:  1.  Travatan eye drops 1 drop in each eye.  2.  Omeprazole 40 mg p.o. daily. 3.  Lactulose as needed for constipation.  4.  Zofran 4 mg every 6 hours as needed for nausea. 5.  Methadone 20 mg every 8 hours as needed for nausea.  6.  Magnesium oxide 400 mg p.o. b.i.d.  7.  Lovenox 80 mg subQ q.12 hours.  8.  Ranitidine 150 mg p.o. daily.  9.  Oxycodone 10  mg 2 tablets every 8 hours.  10.  Duragesic patch 50 mcg (q 72hrs 11.   12.  Decadron (4 mg p.o. daily.     The patient said that he can get a prescription (\from Dr.gGittings fromhiTEXT>> oxycodone and also Duragesic patch from Dr. Lorre NickGittin.    The patient's INR is 1.2. : Sodium is 132. Potassium 3.9, chloride 103, bicarb 26, BUN 5, creatinine 1, glucose 135.    DISCHARGE VITAL SIGNS: Temperature 97.6. Heart rate is 81. Blood pressure 145/80. Sats 98% on room air.   CODE STATUS:  DO NOT RESUSCITATE\ d/w husband TIME SPENT ON DISCHARGE PREPARATION:  More than 30 minutes.   ____________________________ Katha HammingSnehalatha Albertus Chiarelli, MD sk:dmm D: 02/23/2013 21:26:00 ET T: 02/23/2013 21:49:03 ET JOB#: [id number]  cc: Katha HammingSnehalatha Jacon Whetzel, MD, <Dictator> Katha HammingSNEHALATHA Deng Kemler MD ELECTRONICALLY SIGNED 03/08/2013 22:21

## 2014-09-23 NOTE — Op Note (Signed)
PATIENT NAME:  Tillman SersDAMS, Jarmar F MR#:  [id number] DATE OF BIRTH:  24-Aug-1935  DATE OF PROCEDURE:  09/01/2012  PREOPERATIVE DIAGNOSIS: Adenocarcinoma of the prostate.   POSTOPERATIVE DIAGNOSIS: Adenocarcinoma of the prostate, prostatic urethral neoplasm - uncertain.   PROCEDURE: Prostate brachytherapy, cystoscopy, prostatic urethral biopsy.   SURGEON: Assunta GamblesBrian Lamoine Fredricksen, MD  ASSISTANT:  Carmina MillerGlenn Chrystal, MD   ANESTHESIA: Laryngeal mask airway anesthesia.   INDICATIONS: The patient is a 79 year old gentleman recently diagnosed with Gleason's 7 adenocarcinoma of the prostate. He has elected to proceed with prostate brachytherapy as treatment for prostate cancer. He has undergone previous volume study. He presents today for prostate brachytherapy.   DESCRIPTION OF PROCEDURE: After informed consent was obtained, the patient was taken to the operating room and placed in the dorsal lithotomy position under laryngeal mask airway anesthesia. The patient was then prepped and draped in the usual standard fashion. A 16-French Foley catheter was placed to gravity drainage without difficulty. Transrectal ultrasound was then performed in the standard fashion. The ultrasound was aligned to the previous plan. The needle guide was affixed to the ultrasound probe. Once the base of the prostate was identified and the prostate was aligned with the previous plan, the needles were placed anterior to posterior, according to the pre-radiation therapy plan.  A total of 21 needles were placed with a total of 87 seeds. Placement was confirmed in both the transverse and sagittal positions.  Good placement was noted of all the needles. No abnormalities were encountered. Once the needles were placed, the ultrasound probe was removed from the rectum. The Foley catheter was removed from the bladder. Cystoscopy was performed with a 22-French rigid cystoscope. The urethra demonstrated no significant abnormalities. At the level of the sphincter,  just inside of the prostatic urethra, numerous papillary-appearing hypervascular frons were noted extending from the intraprostatic urethra. This extended to approximately mid prostate. This also included the verumontanum region. The prostate was noted to be relatively short with moderate bilobar hyperplasia. Upon entering the bladder, the mucosa was inspected in its entirety with no gross mucosal lesions noted. Bilateral ureteral orifices were well visualized with no lesions noted. No seeds were noted within the bladder. A small clot was noted on the bladder base. Once again no evidence of seeds were noted. There was no evidence of needle passage within the prostatic urethra. Due to concerns over the papillary-appearing findings within the prostatic urethra, cold cup biopsy forceps were utilized to obtain biopsies from the apex of the prostate bilaterally. The mucosa was noted to essentially peel off of the underlying tissue. Minimal bleeding was encountered. No cauterization was undertaken. The scope was advanced back into the urinary bladder. The bladder was drained. The cystoscope was removed. The patient was returned to the supine position. A plain film was obtained confirming good placement of the seeds with a total of 87 seed count confirmed. Proper radiation protocols were followed. The patient was then awakened from laryngeal mask airway anesthesia and was taken to the recovery room in stable condition. There were no problems or complications. The patient tolerated the procedure well.  ____________________________ Madolyn FriezeBrian S. Achilles Dunkope, MD bsc:sb D: 09/01/2012 08:54:25 ET T: 09/01/2012 09:14:57 ET JOB#: [id number]  cc: Madolyn FriezeBrian S. Achilles Dunkope, MD, <Dictator> Rebeca AlertGlenn S. Chrystal, MD Madolyn FriezeBRIAN S Bernhardt Riemenschneider MD ELECTRONICALLY SIGNED 09/07/2012 17:11

## 2014-09-23 NOTE — H&P (Signed)
PATIENT NAME:  Jeffery Cunningham, Jeffery Cunningham MR#:  [id number] DATE OF BIRTH:  [birth year]  DATE OF ADMISSION:  02/12/2013  The patient is admitted to Room 104.  HISTORY OF PRESENT ILLNESS:   Mr. Barren is a 79 year old patient who is well known to me and was also seen earlier today in the [facility] and is admitted for altered mental status, increased weakness and failure to thrive, dehydration. The patient has underlying pancreatic cancer, metastatic to the lung and liver, and recently his scans have progressive disease. He had been planned for salvage chemotherapy with Gemzar and Abraxane beginning next week. He has received intravenous fluid support recently but not today or yesterday, intravenous magnesium on a p.r.n. basis. His nausea is recently controlled, he has moderate pain, but it is controlled on breakthrough medicines with oxycodone. He also takes long-acting Duragesic patch. He has had decreased p.o. intake and has had fluids in the clinic, which had transiently given him increased strength, but fluids in the clinic today gave him no improvement. He has no focal weakness but increasing general weakness and more lower extremity than upper. He cannot get up or transfer without assistance. He has had increasing spells of confusion and disorientation. He has not had headache. He has not had fever, chills or sweats. He is not coughing. He was not short of breath. He does not have palpitations, abdominal and back pain of same character and location as previously described, and as above controlled on narcotics. He has been having waxing and waning lower extremity edema. He was recently diagnosed with lower extremity clot, and he has been on Lovenox injections. The right leg and pain in the right leg is actually improved, but he still has now symmetric lower extremity edema. No rash. No bruising. No focal weakness. No tremor. Low level nausea but no vomiting, no diarrhea. No hiccups, which have been a problem before. No  dysuria or hematuria.   PAST MEDICAL HISTORY: Includes hypertension, hyperlipidemia, glaucoma, GI bleed and anemia. Also recently had attacks of gout. Currently, he has redness and some discomfort in the right foot residual from a recent gout attack.   FAMILY HISTORY: Negative and noncontributory.   SOCIAL HISTORY: No alcohol, no tobacco.   ALLERGIES: No known allergies.   MEDICATIONS: He is on colchicine 0.6 mg p.o. twice a day, Duragesic 75 mcg patches, fresh patch was placed today. Oxycodone 20 mg p.o. every 8 hours p.r.n. Potassium 20 mEq p.o. daily.  Magnesium oxide 400 mg p.o. twice a day. Zofran 4 mg p.o. q.8 hours p.r.n. Omeprazole 20 mg p.o. daily. Travatan 0.004% ophthalmic solution 1 drop to each eye in the evening. Lovenox 80 mg subQ q. 6:00 a.m. and q. 6:00 p.m.   PHYSICAL EXAMINATION: GENERAL: Alert, cooperative, mentally sluggish. He was currently oriented.  HEENT:  Head is atraumatic. Sclerae clear. Mouth: No thrush.  NECK: No mass.  LYMPH: No palpable lymph nodes in the neck, supraclavicular, submandibular or axilla.  LUNGS: Clear. No wheezing, rales or rhonchi.  HEART: Regular.  ABDOMEN: Nontender. No palpable mass or organomegaly.  EXTREMITIES: Now has symmetric 2+ edema below the knees in the feet and ankles. No calf tenderness.  NEUROLOGIC: Grossly nonfocal, but the lower extremities are weaker than the upper extremities.  SKIN: He has healing blister on the lip on the left. Otherwise, no other rashes or skin changes.   LABORATORY DATA: Today, pending. Recently slight elevated alkaline phosphatase. Liver functions have been normal. Magnesium was previously low, most recent was  corrected to 1.8. Creatinine waxes and wanes, recently normal. His hemoglobin of September 9 was 10.8. Platelets were 106. Heparin Xa level was therapeutic at 1.06.   IMPRESSION AND PLAN: The patient is with underlying pancreatic cancer metastatic to liver and lung, has failed FOLFOX with  progressive disease in the lung and liver. He also has a recent deep venous thrombosis and is on Lovenox. He has had recent attack of gout involving the right foot. He has had pain in the foot and leg from clot, which is controlled. He has had abdominal and back pain  which is controlled on narcotics. He has increased weakness secondary to disease process and also possibly medication effect.  He has increased confusion, probably narcotic affect, but could be other CNS issue, possibly other encephalopathy or hepatic or electrolyte. Those results have recently been normal, but will be rechecked.  He has no shortness of breath. No evidence of pulmonary embolism. He has lower extremity clot. He has declining performance status and could not transfer at home without the help of his wife.  She could not move him or handle him. He is increasingly disoriented. He has decreased p.o. intake, although his nausea is better and biochemically did not have gross dehydration. His intake is clearly down.   PLAN: The patient will be admitted. He will be continued on his usual medications, but I will cut back his Duragesic from 75 to 50. Would ascertain the narcotic effect. I would not add nonsteroidals for pain control because of previous GI bleed and because he is on Lovenox. We will get a noncontrast head CT. If he has unexplained mental status changes with normal scan, might later consider if there is a role for MRI. We will consult for home nursing assistance for the signs and symptoms of disease and chemotherapy side effect management moving forward. Still looking at salvage chemotherapy.     ____________________________ Knute Neuobert G. Lorre NickGittin, MD rgg:dmm D: 02/12/2013 15:24:27 ET T: 02/12/2013 19:22:16 ET JOB#: [id number]  cc: Knute Neuobert G. Lorre NickGittin, MD, <Dictator> Marin RobertsOBERT G Raja Liska MD ELECTRONICALLY SIGNED 03/03/2013 11:25

## 2014-09-23 NOTE — Consult Note (Signed)
See consult done by NP today.  Plan to do EGD tomorrow morning due to anemia and heme positive stool.  After that may need colonoscopy or may treat any UGI findings for a while if significant.  Discussed with patient and his wife.    Electronic Signatures: Scot JunElliott, Robert T (MD)  (Signed on 13-Mar-14 20:21)  Authored  Last Updated: 13-Mar-14 20:21 by Scot JunElliott, Robert T (MD)

## 2014-09-23 NOTE — H&P (Signed)
PATIENT NAME:  Jeffery Cunningham, Ammaar F MR#:  [id number] DATE OF BIRTH:  [birth year]  DATE OF ADMISSION:  02/20/2013  PRIMARY CARE PHYSICIAN:  Dr. Mila Merryonald Fisher   ONCOLOGIST: Dr. Benita Gutterobert Gittin   CHIEF COMPLAINT: Generalized weakness, not feeling well, and not drinking well since chemo past Tuesday.   HISTORY OF PRESENT ILLNESS: Jeffery Cunningham is a very pleasant, 79 year old Caucasian gentleman with past medical history of stage IV pancreatic cancer, who is undergoing chemotherapy at the [facility]. The patient had his last chemo past Tuesday, was doing well Wednesday. Since Thursday, he has been overall declining to the point he is not able to take anything orally, feels very nauseated and has lost appetite. He came to the emergency room today, looked very clinically dehydrated, with cracked and chapped lips, and overall appeared very weak and dehydrated. He received a liter of IV fluids. Blood pressure improved a little bit. However, is not well enough and strong enough to go home. Hence, he is being admitted for further evaluation and management of his dehydration in the setting of poor appetite secondary to metastatic pancreatic cancer.   PAST MEDICAL HISTORY: 1.  Metastatic pancreatic cancer, undergoing [facility] with Dr. Lorre NickGittin.  2.  Right lower extremity DVT, diagnosed September 9, on Lovenox subcu b.i.d.  3.  Hyperlipidemia.  4.  Glaucoma.  5.  Anemia of chronic disease.  6.  History of gout.   PAST SURGICAL HISTORY:  None.   FAMILY HISTORY:  Father had lung cancer.   SOCIAL HISTORY:  Lives with wife at home.  Nonsmoker, nonalcoholic.   REVIEW OF SYSTEMS:    CONSTITUTIONAL:  Positive for fatigue, weakness, weight loss. No fever.  EYES:  No blurred or double vision. Positive for glaucoma. No cataracts.  ENT:  No tinnitus, ear pain, hearing loss, snoring. RESPIRATORY:  No cough, wheeze, hemoptysis. Positive for COPD. CARDIOVASCULAR: No chest pain, orthopnea, or dyspnea on  exertion. Positive for leg edema.  GASTROINTESTINAL:  Positive for nausea, poor appetite. No constipation, hematemesis, or acid reflux.  GENITOURINARY:  No dysuria, hematuria, or kidney stones.  ENDOCRINE:  No polyuria, nocturia or thyroid problems.  HEMATOLOGY:  Positive for chronic anemia. No bleeding or any easy bruising.  SKIN:  No acne, rash or lesions.  MUSCULOSKELETAL:  Positive for arthritis. Positive for gout and leg swelling.  NEUROLOGIC:  No CVA or TIA. Positive for generalized weakness.  PSYCHIATRIC: No anxiety or depression. No bipolar disorder.  All other systems reviewed and negative.   PHYSICAL EXAMINATION:  GENERAL:  The patient appears very weak, frail, cachectic.  VITAL SIGNS:  He is afebrile, pulse is 96, blood pressure is 112/94, sats are 95% on room air.  HEENT: Atraumatic, normocephalic. PERLA. EOMI intact. Oral mucosa is dry. No oral lesions noted. The patient's lips are chapped.  NECK:  Supple. No JVD. No carotid bruit.  RESPIRATORY: Clear to auscultation bilaterally. No rales, rhonchi, respiratory distress or labored breathing.  HEART:  Both the heart sounds are normal. Rate, rhythm regular. PMI not lateralized. Chest nontender. No murmur heard.  EXTREMITIES: The patient has pitting edema up to the knee joint bilaterally, 4+. Could not appreciate pedal pulses. Femoral pulses are normally palpable.  SKIN: Warm and dry.  NEUROLOGIC:  Grossly intact cranial nerves II through XII. No motor or sensory deficit. The patient appears globally very weak.  PSYCHIATRIC: The patient is awake, alert, oriented x 3.   LABS:  UA negative for UTI. PT-INR is 15.0 and 1.2.  Chest x-ray:  No acute cardiopulmonary abnormality. There is left basilar airspace disease, which may represent atelectasis versus pneumonia. White count is 6.6, H and H is 9.4 and 29.3, platelet count 91,000. Glucose is 135, rest of the metabolic panel within normal limits. Troponin is 0.02. Magnesium is 2.0.    ASSESSMENT:  Jeffery Cunningham is a 79 year old with history of metastatic pancreatic cancer, undergoing chemotherapy, comes in with:   1.  Clinical dehydration with poor p.o. intake and generalized weakness and overall generalized deconditioning. The patient will be admitted for overnight observation. Will continue IV fluids for now. Will get Physical Therapy involved to see if patient can walk around. He has  significant leg swelling. Typically uses a walker at home, according to the wife. Will also supplement Ensure twice a day, and have dietitian see patient for some other nutritional recommendations. Megace would be recommended if appetite does not improve. I will have Oncology see patient while he is in house.  2. Right lower extremity deep vein thrombosis, recently diagnosed. The patient is on subcu Lovenox b.i.d. Will resume Lovenox 80 mg subcu b.i.d.  3.  History of gout. Continue Colchicine.   4.  Chronic pain. On Duragesic patch, which I will continue.  5.  History of glaucoma. Will continue Travatan eye drops.  6.  Gastroesophageal reflux disease. On ranitidine, which will be resumed. Initiate omeprazole, which will be resumed.   Further workup according to patient's clinical course.   Hospital admission plan was discussed with patient and patient's wife.   The patient is a FULL CODE.   TIME SPENT: 50 minutes.     ____________________________ Wylie Hail Allena Katz, MD sap:mr D: 02/20/2013 19:09:00 ET T: 02/20/2013 19:52:51 ET JOB#: [id number]  cc: Demetrios Isaacs. Sherrie Mustache, MD Knute Neu Lorre Nick, MD Piper Hassebrock A. Allena Katz, MD, <Dictator>    Willow Ora MD ELECTRONICALLY SIGNED 02/22/2013 13:13

## 2014-09-23 NOTE — H&P (Signed)
PATIENT NAME:  Jeffery Cunningham, [name] MR#:  [id number] DATE OF BIRTH:  1935/08/21  DATE OF ADMISSION:  02/20/2013  REFERRING PHYSICIAN: Dr. Enedina FinnerSona Patel.   CONSULTING PHYSICIAN: Imraan Wendell R Harper Vandervoort,   REASON FOR ONCOLOGY CONSULTATION: Pancreatic cancer.   HISTORY OF PRESENT ILLNESS: The patient is a 79 year old gentleman with past medical history significant for stage IV metastatic pancreatic cancer, right lower extremity DVT diagnosed 09/09 on Lovenox, hyperlipidemia, glaucoma, anemia of chronic disease, history of gout, who is followed and treated by oncologist, Dr. Lorre [facility]Gittin at [facility]. More recently patient had progression of pancreatic cancer and he was started on chemotherapy with Abraxane/Gemzar and received first dose last Tuesday on 09/16. The patient states that since then, his overall condition has continued to decline and he is getting for progressively weak. He has not been eating and drinking well. Yesterday wife noticed that he was getting less responsive and slightly disoriented and brought to the Emergency Room and he has been admitted to hospital. He has been started on supportive treatment and IV fluids. Today states that he feels about 10% to 20% better, but still extremely weak and resting in bed. Denies any bleeding symptoms. No diarrhea. No vomiting. Wife present at bedside, states that he is mentally more clear today. He has intermittent back pain issues and is asking for oxycodone dose at this time.   PAST MEDICAL HISTORY/PAST SURGICAL HISTORY: As in HPI above.   FAMILY HISTORY: Noncontributory.   SOCIAL HISTORY: Denies any smoking or alcohol usage. Lives at home with his wife.   ALLERGIES: No known drug allergies.   HOME MEDICATIONS: Colchicine 0.6 mg b.i.d., fentanyl 50 mcg/h patch q. 72 hours,  enoxaparin 80 mg injection subcu every 12 hours, lactulose 15 mL p.r.n., Ativan 1 mg t.i.d. p.r.n.  Magnesium oxide 400 mg b.i.d. omeprazole 40 mg at bedtime. Oxycodone 10 mg 2  tablets q.8 hours p.r.n., Phenergan 25 mg every eight hours p.r.n., Zantac 150 mg daily p.r.n. Travatan eyedrops drop each eye in the evening, Zofran ODT q.6h. p.r.n. for nausea.   REVIEW OF SYSTEMS: CONSTITUTIONAL: As in HPI above. Denies any fevers or chills. No night sweats.  HEENT: Denies any headaches or dizziness at rest. Feels lightheaded on getting up and walking. No epistaxis, ear or jaw pain.  CARDIAC: Denies any angina, palpitation, orthopnea, or PND.  LUNGS: Mild dyspnea on exertion. No new cough, sputum, hemoptysis.  GASTROINTESTINAL: Intermittent nausea, currently feels better. No vomiting or diarrhea. No bright red blood in stools or malena.  GENITOURINARY: No dysuria or hematuria.  MUSCULOSKELETAL: Back pain issues. No new bone pains otherwise.  SKIN: No new rashes or pruritus.  HEMATOLOGIC: No bleeding symptoms.  NEUROLOGIC: As in HPI. Otherwise, no new focal weakness, seizures or loss of consciousness.  ENDOCRINE: No polyuria or polydipsia.   PHYSICAL EXAMINATION: GENERAL: The patient is weak and tired looking, sitting in bed, otherwise awake and oriented to self, place, person, and time and converses appropriately. No acute distress at rest. No icterus.  VITAL SIGNS: 97.8, 80, 20, 112/68, 96% on room air.  HEENT: Normocephalic, atraumatic. Extraocular movements intact. Mouth is dry. No oral thrush.  NECK: Negative for lymphadenopathy.  CARDIOVASCULAR:  S1, S2, regular rate and rhythm.  LUNGS: Lungs show bilateral good breath sounds overall, slightly decreased at bases, no rhonchi.  ABDOMEN: Soft, mild tenderness in the epigastric area and right upper quadrant. Liver edge is palpable. No distention. No ascites clinically.  EXTREMITIES: Mild pedal edema. No cyanosis.  SKIN:  Minor bruising. No generalized rashes.  NEUROLOGIC: Cranial nerves are intact, moves all extremities spontaneously. Gait not checked.  MUSCULOSKELETAL: No obvious joint swelling or redness except in the  except mild redness in the right great toe.   LABORATORY, DIAGNOSTIC, AND RADIOLOGICAL DATA: INR 1.2, PTT 40, WBC 6600, hemoglobin 9.4, platelets 91, creatinine 1.0, potassium 3.9, calcium 7.9, troponin I less than 0.02. Urinalysis 1+ blood, negative for nitrite and leukocyte esterase, trace bacteria only.   IMPRESSION AND RECOMMENDATIONS:  1. Stage IV metastatic pancreatic cancer - the patient recently got first dose of palliative chemotherapy with Abraxane/gemcitabine on 09/16, his performance status has further declined since then and he has become extremely weak and failure to thrive issues. Also, oral intake is extremely poor. He is feeling marginally better with ongoing IV hydration and supportive treatment. We will also had Decadron 4 mg p.o. daily for improving appetite and failure to thrive issues. Also, given poorly controlled back pain, we will change oxycodone to 10 to 20 mg q.3-4h. p.r.n. for pain. Continue current IV fluids for clinical dehydration and poor oral intake. Otherwise, I have discussed with the patient and his wife present at bedside today, they state that they understand he is declining and is not a good candidate to tolerate chemotherapy treatment, and have made a decision not to pursue chemotherapy and want to initiate hospice at home upon discharge from hospital this time. They understand that overall prognosis is poor. We will make hospice screening referral.  2. Lower extremity deep vein thrombosis - continue on Lovenox, no progressive lower extremity pain or symptoms clinically to suggest pulmonary embolism.   We will request his primary oncologist, Dr. Lorre Nick to continue follow-up tomorrow onwards and make further plan of management. The patient and wife present are agreeable to this plan.   Thank you for the referral. Please feel free to contact me if any additional questions.     ____________________________ Maren Reamer Sherrlyn Hock, MD srp:sg D: 02/21/2013 11:49:01  ET T: 02/21/2013 12:28:27 ET JOB#: [id number]  cc: Quamesha Mullet R. Sherrlyn Hock, MD, <Dictator> Wille Celeste MD ELECTRONICALLY SIGNED 02/22/2013 8:41

## 2014-09-23 NOTE — Consult Note (Signed)
Reason for Visit: This 79 year old Male patient presents to the clinic for initial evaluation of  prostate cancer .   Referred by Dr. Assunta Gambles.  Diagnosis:  Chief Complaint/Diagnosis   79 year old male with stage IIa (T1 C. N0 M0) adenocarcinoma the prostate presenting looks Gleason score of 7 (4+3) and a PSA in the 6 range  Pathology Report pathology report reviewed   Imaging Report bone scan not clinically indicated   Referral Report clinical notes reviewed   Planned Treatment Regimen interstitial implant   HPI   patient is a 79 year old male presented with a fairly sunrise in his PSA to 6.3. In 2000 level his PSA was 3.5. He had no significant change in voiding symptoms. Had no evidence of urgency frequency urinary incontinence. He underwent transrectal ultrasound-guided biopsy showing by lobar adenocarcinoma the prostate mostly Gleason score of 6 (3+3) although one core biopsy in the left base showed Gleason 7 (4+3). He's recently been shown to be anemic I do not have the exact numbers for my records although it is being worked up by his private medical doctor. He is also recently been having some increased shortness of breath and dizziness which originally was thought to be related to his Flomax although this is being worked up with a cardiac stress test. Patient has been somewhat fatigued lately. No other significant complaints. He is had an extensive discussion with urology and is seen today for radiation oncology opinion regarding treatment options.is  Past Hx:    Prostate Cancer:    Hypercholesterolemia:    Hypertension:    Back Surgery 1992:    Bilateral Cataracts 2012:    Gall Bladder 1992:   Past, Family and Social History:  Past Medical History positive   Cardiovascular hyperlipidemia; hypertension   Gastrointestinal diverticulitis; GERD   Endocrine diabetes mellitus; diet controlled   Past Surgical History cholecystectomy; back surgery1992, bilateral  cataracts 2012   Family History positive   Family History Comments father with lung cancer   Social History positive   Social History Comments no smoking history social EtOH use history   Additional Past Medical and Surgical History accompanied by his wife today   Allergies:   No Known Allergies:   Home Meds:  Home Medications: Medication Instructions Status  benazepril 10 mg oral tablet 0.5 tab(s) orally once a day Active  simvastatin 20 mg oral tablet 1 tab(s) orally once a day (at bedtime) Active  Aspir-Low 81 mg oral delayed release tablet 1 tab(s) orally once a day Active  Travatan 0.004% ophthalmic solution 1 drop(s) to each affected eye once a day (in the evening) in each eye. Active  multiple vitamin 1 tab(s) orally once a day Active  zolpidem 10 mg. 1 tab(s) orally once a day (at bedtime), As Needed- for Inability to Sleep  Active  tamsulosin 0.4 mg oral capsule 1 cap(s) orally once a day Active   Review of Systems:  General negative   Performance Status (ECOG) 0   Skin negative   Breast negative   Ophthalmologic see HPI   ENMT negative   Respiratory and Thorax negative   Cardiovascular negative   Gastrointestinal negative   Genitourinary see HPI   Musculoskeletal negative   Neurological negative   Psychiatric negative   Hematology/Lymphatics negative   Endocrine negative   Allergic/Immunologic negative   Review of Systems   according to the nurse's notesPatient denies any weight loss, fatigue, weakness, fever, chills or night sweats. Patient denies any loss of vision,  blurred vision. Patient denies any ringing  of the ears or hearing loss. No irregular heartbeat. Patient denies heart murmur or history of fainting. Patient denies any chest pain or pain radiating to her upper extremities. Patient denies any shortness of breath, difficulty breathing at night, cough or hemoptysis. Patient denies any swelling in the lower legs. Patient denies any  nausea vomiting, vomiting of blood, or coffee ground material in the vomitus. Patient denies any stomach pain. Patient states has had normal bowel movements no significant constipation or diarrhea. Patient denies any dysuria, hematuria or significant nocturia. Patient denies any problems walking, swelling in the joints or loss of balance. Patient denies any skin changes, loss of hair or loss of weight. Patient denies any excessive worrying or anxiety or significant depression. Patient denies any problems with insomnia. Patient denies excessive thirst, polyuria, polydipsia. Patient denies any swollen glands, patient denies easy bruising or easy bleeding. Patient denies any recent infections, allergies or URI. Patient "s visual fields have not changed significantly in recent time.  Nursing Notes:  Nursing Vital Signs and Chemo Nursing Nursing Notes: *CC Vital Signs Flowsheet:   10-Feb-14 08:07  Temp Temperature 97.9  Pulse Pulse 99  Respirations Respirations 20  SBP SBP 174  DBP DBP 79  Pain Scale (0-10)  0  Current Weight (kg) (kg) 99.8  Height (cm) centimeters 183  BSA (m2) 2.2   Physical Exam:  General/Skin/HEENT:  General normal   Skin normal   Eyes normal   ENMT normal   Head and Neck normal   Additional PE well-developed well-nourished male in NAD. Lungs are clear to A&P cardiac examination shows regular rate and rhythm. Abdomen is benign. On rectal exam rectal sphincter tone is good. Prostate is somewhatfirm has a nodular consistency to it although sulcus is preserved bilaterally. No other rectal abnormalities identified.   Breasts/Resp/CV/GI/GU:  Respiratory and Thorax normal   Cardiovascular normal   Gastrointestinal normal   Genitourinary normal   MS/Neuro/Psych/Lymph:  Musculoskeletal normal   Neurological normal   Lymphatics normal   Assessment and Plan: Impression:   stage IIa adenocarcinoma of the prostate in 79 year old male Plan:   at this time I got  over treatment recommendations with the patient and his wife. I would like you low intermediate risk for extracapsular extension probably less than 40% based only one core showing Gleason 7 (4+3). His PSAs also in the favorable range. I do not feel he needs external beam radiation therapy to his prostatic nodes and would be a suitable candidate for prostate brachytherapy. We discussed other treatment options. Although the patient is favoring going ahead with brachytherapy. Risks and benefits of treatment including problems with urinary symptoms, GI symptoms, overall fatigue and risks of general anesthesia were all explained in detail to the patient. We will go ahead and schedule a volume study along with Dr. Achilles Dunkope and plan on delivering 145 gray to his prostatic volume using BrachyVision treatment planning. I will review his workup regarding anemia as well as his cardiac stress test when they become available.  I would like to take this opportunity to thank you for allowing me to continue to participate in this patient's care.  CC Referral:  cc: Dr. Sherrie MustacheFisher, Dr. Assunta GamblesBrian Cope   Electronic Signatures: Rushie Chestnuthrystal, Gordy CouncilmanGlenn S (MD)  (Signed 10-Feb-14 10:24)  Authored: HPI, Diagnosis, Past Hx, PFSH, Allergies, Home Meds, ROS, Nursing Notes, Physical Exam, Encounter Assessment and Plan, CC Referring Physician   Last Updated: 10-Feb-14 10:24 by Rebeca Alerthrystal, Korinne Greenstein S (MD)

## 2014-09-23 NOTE — Consult Note (Signed)
The time of the procedure was 10:15, not 9:15, [place] PodSunrise must not be on proper daylight savings time yet.  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  Clear liq diet today and full liquids tomorrow.  Check serial hgb daily.  Electronic Signatures: Scot JunElliott, Robert T (MD)  (Signed on 14-Mar-14 10:47)  Authored  Last Updated: 14-Mar-14 10:47 by Scot JunElliott, Robert T (MD)

## 2014-11-17 ENCOUNTER — Other Ambulatory Visit: Payer: Self-pay | Admitting: Orthopedic Surgery

## 2014-11-17 DIAGNOSIS — R200 Anesthesia of skin: Secondary | ICD-10-CM

## 2014-11-18 ENCOUNTER — Other Ambulatory Visit: Payer: Self-pay
